# Patient Record
Sex: Female | Born: 1980 | Race: White | Hispanic: No | Marital: Single | Smoking: Current every day smoker
Health system: Southern US, Community
[De-identification: ages and names within clinical notes are randomized; demographics above are authoritative.]

## PROBLEM LIST (undated history)

## (undated) DIAGNOSIS — S6440XA Injury of digital nerve of unspecified finger, initial encounter: Secondary | ICD-10-CM

## (undated) DIAGNOSIS — F419 Anxiety disorder, unspecified: Secondary | ICD-10-CM

## (undated) DIAGNOSIS — F319 Bipolar disorder, unspecified: Secondary | ICD-10-CM

## (undated) DIAGNOSIS — F1011 Alcohol abuse, in remission: Secondary | ICD-10-CM

## (undated) HISTORY — PX: OTHER SURGICAL HISTORY: SHX169

---
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
## 2002-09-18 ENCOUNTER — Emergency Department (HOSPITAL_COMMUNITY): Admission: EM | Admit: 2002-09-18 | Discharge: 2002-09-18 | Payer: Self-pay | Admitting: Emergency Medicine

## 2002-12-18 ENCOUNTER — Emergency Department (HOSPITAL_COMMUNITY): Admission: EM | Admit: 2002-12-18 | Discharge: 2002-12-18 | Payer: Self-pay | Admitting: Emergency Medicine

## 2004-02-25 ENCOUNTER — Emergency Department (HOSPITAL_COMMUNITY): Admission: EM | Admit: 2004-02-25 | Discharge: 2004-02-25 | Payer: Self-pay | Admitting: *Deleted

## 2004-03-03 ENCOUNTER — Emergency Department (HOSPITAL_COMMUNITY): Admission: EM | Admit: 2004-03-03 | Discharge: 2004-03-03 | Payer: Self-pay

## 2005-04-01 ENCOUNTER — Emergency Department (HOSPITAL_COMMUNITY): Admission: EM | Admit: 2005-04-01 | Discharge: 2005-04-01 | Payer: Self-pay | Admitting: Emergency Medicine

## 2006-02-07 ENCOUNTER — Ambulatory Visit (HOSPITAL_COMMUNITY): Admission: EM | Admit: 2006-02-07 | Discharge: 2006-02-08 | Payer: Self-pay | Admitting: Emergency Medicine

## 2008-02-12 ENCOUNTER — Emergency Department (HOSPITAL_COMMUNITY): Admission: EM | Admit: 2008-02-12 | Discharge: 2008-02-12 | Payer: Self-pay | Admitting: Emergency Medicine

## 2010-11-20 NOTE — Op Note (Signed)
Shelly Tucker, Shelly Tucker                ACCOUNT NO.:  [id number]   MEDICAL RECORD NO.:  [id number]          PATIENT TYPE:  OIB   LOCATION:  0098                         FACILITY:  [facility]   PHYSICIAN:  Mark C. Ophelia Charter, M.D.    DATE OF BIRTH:  December 29, 1980   DATE OF PROCEDURE:  02/07/2006  DATE OF DISCHARGE:  02/08/2006                                 OPERATIVE REPORT   PREOPERATIVE DIAGNOSIS:  Multiple right forearm glass lacerations.   PROCEDURE:  1.  Exploration and repair of radial artery and brachial radialis tendon.  2.  Exploration and removal of large glass fragment proximal forearm.  3.  Closure of multiple lacerations.  Total of 10.5 cm.   SURGEON:  Mark C. Ophelia Charter, M.D.   ANESTHESIA:  General endotracheal.   PROCEDURE:  After induction of general anesthesia preoperative Ancef was  given.  The arm was scrubbed with Betadine scrub.  The patient had a large  glass fragment by x-ray and this was palpated with the fingertip, grasped  with the hemostat and removed from the operative field at the beginning of  the procedure prior to scrubbing and prepping.  Tourniquet was then inflated  during the prepping and draping.  Usual extremity sheets and drapes were  applied.  The tourniquet was dropped and a Doppler was used to check.  There  was no pulsatile bleeding noted. There was transverse laceration three  fingerbreadths above the distal wrist crease.  Checked there was a radial  artery and brachial radialis.  This was opened after exploration revealed  normal position of the radial artery and that the artery was lacerated.  It  was opened with the proximal and distal extension making a C-shaped incision  with 4-0 Nylon placed in the skin flaps. Arteries were cleaned off on both  ends.  The loop magnification was used initially and then an operative  microscope was draped and brought in.  The soft tissue was cleaned back and  using 8-0 Nylon with simple interrupted sutures back wall first  and then  multiple simple sutures with the double vessel clamp applied to help hold  the artery.  And actually three sutures were used spaced out and then  individual sutures in between flipping back and forth with the vessel clamp  over.  Saline solution was used and the pick ups were used as well as the  dilators.  There was back flow bleeding as well as antegrade bleeding.  Intermittently, after one or two sutures are placed, clamps are released for  antegrade and retrograde bleeding.  Once final sutures were placed for a  total of nine or 10 sutures, one remaining suture is placed with a small  amount of leakage and then the vessel had excellent flow.  There was  excellent flow, retrograde flow and good Doppler flow with the sterile  Doppler. All the arteries showed less flow distally.  Another laceration  slightly more toward the midline from the initial one with the radial artery  lacerated, showed the laceration of the palmaris longus but the median nerve  was not damaged.  The patient had intact median nerve sensation  preoperatively and this looked like he fitted just through the fascia but  not into the muscle.  The profundus and sub-oblique tendons were intact.  The largest laceration with the largest glass fragment was placed within the  proximal fourth of the forearm slightly more ulnar than in the radial and  the fragment extended down between two bones, which was the glass fragment  that was removed at the beginning.  Ulnar artery was palpated.  During the  case the tourniquet was initially put up for prepping and draping for a  period of 12 minutes and then deflated for identification of the radial  artery with good flow.  The clamp was applied and it was put up after the  radial artery was repaired for 2-0 Ethibond suture of the brachial radialis  tendon.  Flexor carpi radialis also had a partial laceration to it and it  was repaired with 3-0 Ethibond suture.  Next the  proximal incision was  inspected and lacerations deep into the flexor muscles.  There was teeny  tendon remnants and with motion of the fingers it appeared that this is in  the superficialis tendons.  Ulnar pulse was palpated with the finger tip as  well as the Doppler just lateral to the laceration, confirmed with Doppler.  After copious irrigation of this wound, the skin is closed with 4-0 Nylon  closing all wounds and confirming that the radial artery was repaired.  There was normal Allen's test at the wrist.  Excellent capillary refill and  normal Doppler's of both radial and ulnar arteries by this time with good  ulnar flow at the wrist as well.  It appeared that the ulnar artery had had  some compression from the hematoma from the laceration when the glass  fragment was present and this was washed out and evacuated.  The spasm of  the vessel subsided with good flow.  Xeroform 4 x 4s, web roll and dorsal  wrist splint was applied with the wrist in flexion.  Instrument count and  needle count was correct including the 8-0 needles.  Dressings were placed  and the patient was transferred to the recovery room in stable condition.      Mark C. Ophelia Charter, M.D.  Electronically Signed     MCY/MEDQ  D:  02/07/2006  T:  02/08/2006  Job:  [id number]

## 2010-11-20 NOTE — Discharge Summary (Signed)
NAMEAERON, LHEUREUX                ACCOUNT NO.:  [id number]   MEDICAL RECORD NO.:  [id number]          PATIENT TYPE:  OIB   LOCATION:  0098                         FACILITY:  [facility]   PHYSICIAN:  Mark C. Ophelia Charter, M.D.    DATE OF BIRTH:  April 15, 1981   DATE OF ADMISSION:  02/07/2006  DATE OF DISCHARGE:  02/08/2006                               DISCHARGE SUMMARY   FINAL DIAGNOSES:  1. Right forearm laceration with pulsatile uncontrolled bleeding.  2. Alcohol intoxication.   A 30 year old female presented with a forearm laceration, pulsatile  bleeding uncontrolled, and slurred speech.  The patient was  uncooperative with examination and had a pressure dressing on.  No  distention of the fingers secondary to pressure dressing.  X-ray showed  laceration of the forearm.  She was taken to the operating room on an  emergent basis where she underwent repair of the radial artery using the  microscope, repair of the brachial radialis tendon, flexor carpi  radialis tendon, and removal of glass fragment.  Tourniquet time was 25  minutes.  Postoperatively, the patient complained of arm pain.  Her  alcohol level was 313 on admission.  She complained of some decreased  sensation in the ulnar nerve distribution.  Profundus and ring finger  flexors were working, first dorsal interosseous was working.  She was  taking Tylox for pain and was stable.  She was discharged with office  follow-up in 1 week.      Mark C. Ophelia Charter, M.D.  Electronically Signed     MCY/MEDQ  D:  05/25/2006  T:  05/25/2006  Job:  [id number]

## 2012-02-01 ENCOUNTER — Emergency Department (HOSPITAL_COMMUNITY)
Admission: EM | Admit: 2012-02-01 | Discharge: 2012-02-01 | Discharge status: Against Medical Advice | Payer: Self-pay | Attending: Emergency Medicine | Admitting: Emergency Medicine

## 2012-02-01 ENCOUNTER — Emergency Department (HOSPITAL_COMMUNITY): Payer: Self-pay

## 2012-02-01 DIAGNOSIS — M79609 Pain in unspecified limb: Secondary | ICD-10-CM | POA: Insufficient documentation

## 2012-02-01 DIAGNOSIS — M7989 Other specified soft tissue disorders: Secondary | ICD-10-CM | POA: Insufficient documentation

## 2012-02-01 NOTE — ED Notes (Signed)
Patient not in waiting area.

## 2012-02-01 NOTE — ED Notes (Signed)
PT reports walking in yard and turned ankle . Pt now has pain and swelling. Pt reports having multiple beers starting this afternoon.

## 2012-02-02 ENCOUNTER — Encounter (HOSPITAL_COMMUNITY): Payer: Self-pay | Admitting: *Deleted

## 2012-02-02 ENCOUNTER — Emergency Department (HOSPITAL_COMMUNITY)
Admission: EM | Admit: 2012-02-02 | Discharge: 2012-02-02 | Disposition: A | Discharge status: Home / Self Care | Payer: Self-pay | Attending: Emergency Medicine | Admitting: Emergency Medicine

## 2012-02-02 ENCOUNTER — Emergency Department (HOSPITAL_COMMUNITY): Payer: Self-pay

## 2012-02-02 DIAGNOSIS — E119 Type 2 diabetes mellitus without complications: Secondary | ICD-10-CM | POA: Insufficient documentation

## 2012-02-02 DIAGNOSIS — W1789XA Other fall from one level to another, initial encounter: Secondary | ICD-10-CM | POA: Insufficient documentation

## 2012-02-02 DIAGNOSIS — F1010 Alcohol abuse, uncomplicated: Secondary | ICD-10-CM | POA: Insufficient documentation

## 2012-02-02 DIAGNOSIS — S82899A Other fracture of unspecified lower leg, initial encounter for closed fracture: Secondary | ICD-10-CM | POA: Insufficient documentation

## 2012-02-02 MED ORDER — LORAZEPAM 1 MG PO TABS: 0.0000 mg | ORAL_TABLET | Freq: Four times a day (QID) | ORAL | Status: DC

## 2012-02-02 MED ORDER — ZOLPIDEM TARTRATE 5 MG PO TABS: 5.0000 mg | ORAL_TABLET | Freq: Every evening | ORAL | Status: DC | PRN

## 2012-02-02 MED ORDER — VITAMIN B-1 100 MG PO TABS: 100.0000 mg | ORAL_TABLET | Freq: Every day | ORAL | Status: DC

## 2012-02-02 MED ORDER — ADULT MULTIVITAMIN W/MINERALS CH: 1.0000 | ORAL_TABLET | Freq: Every day | ORAL | Status: DC

## 2012-02-02 MED ORDER — LORAZEPAM 2 MG/ML IJ SOLN: 1.0000 mg | Freq: Four times a day (QID) | INTRAMUSCULAR | Status: DC | PRN

## 2012-02-02 MED ORDER — LORAZEPAM 1 MG PO TABS: 0.0000 mg | ORAL_TABLET | Freq: Two times a day (BID) | ORAL | Status: DC

## 2012-02-02 MED ORDER — NICOTINE 21 MG/24HR TD PT24: 21.0000 mg | MEDICATED_PATCH | Freq: Every day | TRANSDERMAL | Status: DC

## 2012-02-02 MED ORDER — IBUPROFEN 400 MG PO TABS: 600.0000 mg | ORAL_TABLET | Freq: Three times a day (TID) | ORAL | Status: DC | PRN

## 2012-02-02 MED ORDER — FOLIC ACID 1 MG PO TABS: 1.0000 mg | ORAL_TABLET | Freq: Every day | ORAL | Status: DC

## 2012-02-02 MED ORDER — LORAZEPAM 1 MG PO TABS: 1.0000 mg | ORAL_TABLET | Freq: Four times a day (QID) | ORAL | Status: DC | PRN

## 2012-02-02 MED ORDER — THIAMINE HCL 100 MG/ML IJ SOLN: 100.0000 mg | Freq: Every day | INTRAMUSCULAR | Status: DC

## 2012-02-02 NOTE — ED Provider Notes (Addendum)
History    This chart was scribed for Toy Baker, MD, MD by Smitty Pluck. The patient was seen in room TR05C and the patient's care was started at 1:50PM.   CSN: [id number]  Arrival date & time 02/02/12  1243   None     No chief complaint on file.   (Consider location/radiation/quality/duration/timing/severity/associated sxs/prior treatment) The history is provided by the patient.   Shelly Tucker is a 31 y.o. female who presents to the Emergency Department complaining of moderate left ankle pain onset 1 day ago and wanting help with alcohol addiction. Pt reports that she was intoxicated and fell in hole last night. She felt a snap in her left ankle during fall. She reports that she can not bear weight on left ankle. She has injured right ankle in past. Pt denies SI and HI. Pt drinks beer, wine and liquor. Pt reports using marijuana. Pt denies being in rehab before. Pt called [facility] for assistance.   Past Medical History  Diagnosis Date  . Diabetes mellitus     History reviewed. No pertinent past surgical history.  No family history on file.  History  Substance Use Topics  . Smoking status: Not on file  . Smokeless tobacco: Not on file  . Alcohol Use: Yes    OB History    Grav Para Term Preterm Abortions TAB SAB Ect Mult Living                  Review of Systems  Constitutional: Negative for fever and chills.  Respiratory: Negative for shortness of breath.   Gastrointestinal: Negative for nausea and vomiting.  Neurological: Negative for weakness.  All other systems reviewed and are negative.    Allergies  Review of patient's allergies indicates no known allergies.  Home Medications  No current outpatient prescriptions on file.  BP 133/81  Pulse 116  Temp 98.3 F (36.8 C) (Oral)  Resp 16  SpO2 96%  LMP 11/02/2011  Physical Exam  Nursing note and vitals reviewed. Constitutional: She is oriented to person, place, and time. She appears  well-developed and well-nourished. No distress.  HENT:  Head: Normocephalic and atraumatic.  Eyes: Conjunctivae are normal.  Neck: Normal range of motion. Neck supple.  Pulmonary/Chest: Effort normal. No respiratory distress.  Musculoskeletal:       Left ankle swelling and ecchymosis with tenderness along lateral and medial malleolus.  Neurovascular intact  Neurological: She is alert and oriented to person, place, and time.  Skin: Skin is warm and dry.  Psychiatric: She has a normal mood and affect. Her behavior is normal.    ED Course  Procedures (including critical care time) DIAGNOSTIC STUDIES: Oxygen Saturation is 96% on room air, normal by my interpretation.    COORDINATION OF CARE: 1:56PM EDP discusses pt ED treatment with pt     Labs Reviewed - No data to display Dg Ankle Complete Left  02/02/2012  *RADIOLOGY REPORT*  Clinical Data: Stepped in a hole last night, now with ankle pain, swelling and bruising  LEFT ANKLE COMPLETE - 3+ VIEW  Comparison: None.  Findings:  There is an oblique, minimally displaced fracture of the distal fibula which extends to the distal tib-fib joint.  This finding is without definite displacement of the ankle mortise, though the mortise radiograph is slightly degraded secondary to obliquity.  No definite ankle joint effusion.   Incidental note is made of a small bone island within the distal tibia as well as a prominent os  trigonum.  There is expected adjacent soft tissue swelling.  No radiopaque foreign body.  IMPRESSION: Oblique, minimally displaced fracture the distal tibia with extension to the distal tib-fib joint.  Original Report Authenticated By: Waynard Reeds, M.D.     No diagnosis found.    MDM  Pt to have splint applied to left ankle, she has mobile crisis team member here who will work on placement for her etoh abuse   I personally performed the services described in this documentation, which was scribed in my presence. The  recorded information has been reviewed and considered.  3:27 PM Pt has changed her mind and now doesn't want detox--ankle splinted by tech and pt given crutches and ortho referral      Toy Baker, MD 02/02/12 1405  Toy Baker, MD 02/02/12 [phone]

## 2012-02-02 NOTE — ED Notes (Signed)
Larey Seat in a hole last night, and hurting lt. Ankle. Deformity. Swelling, cms intact. Here last night but left ama.

## 2012-02-02 NOTE — ED Notes (Signed)
Tried to call patient from contact information no answer.

## 2012-02-02 NOTE — ED Notes (Signed)
Continue from secondary Able to move all toes equal.  Here with detox representative who patient called today.  States have been drinking for years and one day ago drank beer and tequila mix unknown amount.  Calm cooperative states did not drink today.  Denies HI or SI.

## 2012-02-02 NOTE — Progress Notes (Signed)
Orthopedic Tech Progress Note Patient Details:  Shelly Tucker 1980-11-24 [id number]  Ortho Devices Type of Ortho Device: Ace wrap;Post (short) splint Splint Material: Fiberglass Ortho Device/Splint Location: (L) LE Ortho Device/Splint Interventions: Application   Jennye Moccasin 02/02/2012, 3:21 PM

## 2012-02-02 NOTE — ED Notes (Signed)
repaged ortho to apply splint.

## 2012-02-02 NOTE — ED Notes (Signed)
Patient back in room. Stated upset because boyfriend was in [facility]

## 2012-02-02 NOTE — ED Notes (Signed)
Looking for patient in ED, ED waiting room, and outside. Unable to find patient at this time.

## 2012-02-02 NOTE — ED Notes (Signed)
EDP Speaking with patient who is requesting not to stay for detox.

## 2012-02-02 NOTE — ED Notes (Signed)
Patient will be discharged and will follow up with out patient detox referral or come back to ED.

## 2012-02-02 NOTE — ED Notes (Signed)
Spoke with Ortho will apply splint

## 2012-02-08 ENCOUNTER — Emergency Department (HOSPITAL_COMMUNITY): Payer: Self-pay

## 2012-02-08 ENCOUNTER — Emergency Department (HOSPITAL_COMMUNITY)
Admission: EM | Admit: 2012-02-08 | Discharge: 2012-02-08 | Disposition: A | Discharge status: Home Health | Payer: Self-pay | Attending: Emergency Medicine | Admitting: Emergency Medicine

## 2012-02-08 ENCOUNTER — Encounter (HOSPITAL_COMMUNITY): Payer: Self-pay

## 2012-02-08 DIAGNOSIS — S82899A Other fracture of unspecified lower leg, initial encounter for closed fracture: Secondary | ICD-10-CM | POA: Insufficient documentation

## 2012-02-08 DIAGNOSIS — Y998 Other external cause status: Secondary | ICD-10-CM | POA: Insufficient documentation

## 2012-02-08 DIAGNOSIS — Y9301 Activity, walking, marching and hiking: Secondary | ICD-10-CM | POA: Insufficient documentation

## 2012-02-08 DIAGNOSIS — Y92410 Unspecified street and highway as the place of occurrence of the external cause: Secondary | ICD-10-CM | POA: Insufficient documentation

## 2012-02-08 DIAGNOSIS — E119 Type 2 diabetes mellitus without complications: Secondary | ICD-10-CM | POA: Insufficient documentation

## 2012-02-08 DIAGNOSIS — S82892A Other fracture of left lower leg, initial encounter for closed fracture: Secondary | ICD-10-CM

## 2012-02-08 DIAGNOSIS — X58XXXA Exposure to other specified factors, initial encounter: Secondary | ICD-10-CM | POA: Insufficient documentation

## 2012-02-08 MED ORDER — NAPROXEN 500 MG PO TABS: 500.0000 mg | ORAL_TABLET | Freq: Two times a day (BID) | ORAL | Status: AC

## 2012-02-08 NOTE — Progress Notes (Signed)
Orthopedic Tech Progress Note Patient Details:  Shelly Tucker [birth year] [id number]  Ortho Devices Type of Ortho Device: Short leg splint;Stirrup splint Splint Material: Fiberglass Ortho Device/Splint Interventions: Application   Cammer, Mickie Bail 02/08/2012, 1:20 PM

## 2012-02-08 NOTE — ED Provider Notes (Signed)
History   This chart was scribed for Shelly Jakes, MD by Shelly Tucker. The patient was seen in room TR06C/TR06C. Patient's care was started at 1013.     CSN: [id number]  Arrival date & time 02/08/12  1013   First MD Initiated Contact with Patient 02/08/12 1111      Chief Complaint  Patient presents with  . Ankle Pain  . Follow-up    (Consider location/radiation/quality/duration/timing/severity/associated sxs/prior treatment) Patient is a 31 y.o. female presenting with ankle pain. The history is provided by the patient. No language interpreter was used.  Ankle Pain  The incident occurred in the street. The injury mechanism was torsion. The pain is present in the left ankle. The pain is moderate. The pain has been constant since onset. Associated symptoms include inability to bear weight. She reports no foreign bodies present. The symptoms are aggravated by activity and bearing weight.    Shelly Tucker is a 31 y.o. female who presents to the Emergency Department complaining of constant, moderate left ankle pain after re-injuring her left ankle. Patient states that she tripped while stepping up onto the sidewalk. Patient states that she hasn't been able to get her hard cast placed at the orthopedist's office because she doesn't have insurance. Patient denies any other symptoms.  Previous Chart Review: Patient was seen on 02/02/12 by Dr. Lorre Tucker complaining of left ankle pain after twisting her ankle on 02/01/12. The X-ray taken at that time showed a mininal displaced fracture of the distal tibia. Patient was fitted with a splint to the left ankle and referred to an orthopedist.    Past Medical History  Diagnosis Date  . Diabetes mellitus     No past surgical history on file.  No family history on file.  History  Substance Use Topics  . Smoking status: Not on file  . Smokeless tobacco: Not on file  . Alcohol Use: Yes    OB History    Grav Para Term Preterm  Abortions TAB SAB Ect Mult Living                  Review of Systems  Constitutional: Negative for fever.  Respiratory: Negative for shortness of breath.   Cardiovascular: Negative for chest pain.  Gastrointestinal: Negative for abdominal pain.  Neurological: Negative for headaches.    Allergies  Review of patient's allergies indicates no known allergies.  Home Medications   Current Outpatient Rx  Name Route Sig Dispense Refill  . NAPROXEN 500 MG PO TABS Oral Take 1 tablet (500 mg total) by mouth 2 (two) times daily. 14 tablet 0    BP 140/82  Pulse 104  Temp 98.6 F (37 C) (Oral)  Resp 18  SpO2 99%  LMP 11/02/2011  Physical Exam  Cardiovascular: Normal rate and regular rhythm.   No murmur heard. Pulmonary/Chest: Effort normal and breath sounds normal. No respiratory distress. She has no wheezes.  Abdominal: Bowel sounds are normal. There is no tenderness.  Musculoskeletal:       Left ankle: tenderness.       Bruising on the lateral part of the left ankle more than medial. There is bruising that has accumulated on the left forefoot and the toes.  Skin:       Capillary refills less than 1 second.    ED Course  Procedures (including critical care time) DIAGNOSTIC STUDIES: Oxygen Saturation is 99% on room air, normal  by my interpretation.    COORDINATION OF CARE: 11:20am-  Patient informed of current plan for treatment and evaluation and agrees with plan at this time. Will do another X-ray of left ankle.  Dg Ankle Complete Left  02/08/2012  *RADIOLOGY REPORT*  Clinical Data: Fall, ankle pain.  LEFT ANKLE COMPLETE - 3+ VIEW  Comparison: 02/02/2012  Findings: The oblique distal fibular fracture is again noted. Increasing displacement since prior study.  Overlying soft tissue swelling.  No acute tibial abnormality.  The sclerotic focus in the medial malleolus likely bone island.  IMPRESSION: Slight increased displacement of the oblique distal fibular fracture.  Original  Report Authenticated By: Cyndie Chime, M.D.     1. Ankle fracture, left       MDM  Patient status post ankle fracture at the end of July has not follow up with orthopedic shaft today splint was loose and also soft and not really providing adequate support x-ray repeated which shows column signs of worsening displacement compared to the original fracture patient did note that she had reinjured the ankle. Free cast with a stirrup type splint and give referral to orthopedics.     I personally performed the services described in this documentation, which was scribed in my presence. The recorded information has been reviewed and considered.    Shelly Jakes, MD 02/08/12 [phone]

## 2012-02-08 NOTE — ED Notes (Signed)
Pt with broken ankle, sts unable to follow up with orthopeadic due to insurance so here today for hard cast and reevaluation.

## 2013-07-29 ENCOUNTER — Emergency Department (HOSPITAL_COMMUNITY): Payer: Self-pay

## 2013-07-29 ENCOUNTER — Encounter (HOSPITAL_COMMUNITY): Payer: Self-pay | Admitting: Emergency Medicine

## 2013-07-29 ENCOUNTER — Emergency Department (HOSPITAL_COMMUNITY)
Admission: EM | Admit: 2013-07-29 | Discharge: 2013-07-29 | Disposition: A | Discharge status: Home / Self Care | Payer: Self-pay | Attending: Emergency Medicine | Admitting: Emergency Medicine

## 2013-07-29 DIAGNOSIS — S61209A Unspecified open wound of unspecified finger without damage to nail, initial encounter: Secondary | ICD-10-CM | POA: Insufficient documentation

## 2013-07-29 DIAGNOSIS — Y93G1 Activity, food preparation and clean up: Secondary | ICD-10-CM | POA: Insufficient documentation

## 2013-07-29 DIAGNOSIS — S61216A Laceration without foreign body of right little finger without damage to nail, initial encounter: Secondary | ICD-10-CM

## 2013-07-29 DIAGNOSIS — W268XXA Contact with other sharp object(s), not elsewhere classified, initial encounter: Secondary | ICD-10-CM | POA: Insufficient documentation

## 2013-07-29 DIAGNOSIS — IMO0002 Reserved for concepts with insufficient information to code with codable children: Secondary | ICD-10-CM | POA: Insufficient documentation

## 2013-07-29 DIAGNOSIS — Y929 Unspecified place or not applicable: Secondary | ICD-10-CM | POA: Insufficient documentation

## 2013-07-29 DIAGNOSIS — Z23 Encounter for immunization: Secondary | ICD-10-CM | POA: Insufficient documentation

## 2013-07-29 DIAGNOSIS — F1011 Alcohol abuse, in remission: Secondary | ICD-10-CM | POA: Insufficient documentation

## 2013-07-29 DIAGNOSIS — F17200 Nicotine dependence, unspecified, uncomplicated: Secondary | ICD-10-CM | POA: Insufficient documentation

## 2013-07-29 MED ORDER — CEPHALEXIN 500 MG PO CAPS: 500.0000 mg | ORAL_CAPSULE | Freq: Four times a day (QID) | ORAL | Status: DC

## 2013-07-29 MED ORDER — ACETAMINOPHEN-CODEINE #3 300-30 MG PO TABS
1.0000 | ORAL_TABLET | Freq: Four times a day (QID) | ORAL | Status: DC | PRN
Start: 2013-07-29 — End: 2013-10-23

## 2013-07-29 MED ORDER — HYDROCODONE-ACETAMINOPHEN 5-325 MG PO TABS
2.0000 | ORAL_TABLET | Freq: Once | ORAL | Status: AC
  Administered 2013-07-29: 2 via ORAL
  Filled 2013-07-29: qty 2

## 2013-07-29 MED ORDER — TETANUS-DIPHTH-ACELL PERTUSSIS 5-2.5-18.5 LF-MCG/0.5 IM SUSP
0.5000 mL | Freq: Once | INTRAMUSCULAR | Status: AC
  Administered 2013-07-29: 0.5 mL via INTRAMUSCULAR
  Filled 2013-07-29: qty 0.5

## 2013-07-29 NOTE — ED Notes (Signed)
Pt cut her hand on a glass while washing a glass, pt has a deep lac between the ring and pinky finger on the right hand , bleeding is controlled

## 2013-07-29 NOTE — Discharge Instructions (Signed)
Finger Avulsion  °When the tip of the finger is lost, a new nail may grow back if part of the fingernail is left. The new nail may be deformed. If just the tip of the finger is lost, no repair may be needed unless there is bone showing. If bone is showing, your caregiver may need to remove the protruding bone and put on a bandage. Your caregiver will do what is best for you. Most of the time when a fingertip is lost, the end will gradually grow back on and look fairly normal, but it may remain sensitive to pressure and temperature extremes for a long time. °HOME CARE INSTRUCTIONS  °· Keep your hand elevated above your heart to relieve pain and swelling. °· Keep your dressing dry and clean. °· Change your bandage in 24 hours or as directed. °· Only take over-the-counter or prescription medicines for pain, discomfort, or fever as directed by your caregiver. °· See your caregiver as needed for problems. °SEEK MEDICAL CARE IF:  °· You have increased pain, swelling, drainage, or bleeding. °· You have a fever. °· You have swelling that spreads from your finger and into your hand. °Make sure to check to see if you need a tetanus booster. °Document Released: 08/30/2001 Document Revised: 12/21/2011 Document Reviewed: 07/25/2008 °ExitCare® Patient Information ©2014 [employer]. ° °Laceration Care, Adult °A laceration is a cut or lesion that goes through all layers of the skin and into the tissue just beneath the skin. °TREATMENT  °Some lacerations may not require closure. Some lacerations may not be able to be closed due to an increased risk of infection. It is important to see your caregiver as soon as possible after an injury to minimize the risk of infection and maximize the opportunity for successful closure. °If closure is appropriate, pain medicines may be given, if needed. The wound will be cleaned to help prevent infection. Your caregiver will use stitches (sutures), staples, wound glue (adhesive), or skin adhesive  strips to repair the laceration. These tools bring the skin edges together to allow for faster healing and a better cosmetic outcome. However, all wounds will heal with a scar. Once the wound has healed, scarring can be minimized by covering the wound with sunscreen during the day for 1 full year. °HOME CARE INSTRUCTIONS  °For sutures or staples: °· Keep the wound clean and dry. °· If you were given a bandage (dressing), you should change it at least once a day. Also, change the dressing if it becomes wet or dirty, or as directed by your caregiver. °· Wash the wound with soap and water 2 times a day. Rinse the wound off with water to remove all soap. Pat the wound dry with a clean towel. °· After cleaning, apply a thin layer of the antibiotic ointment as recommended by your caregiver. This will help prevent infection and keep the dressing from sticking. °· You may shower as usual after the first 24 hours. Do not soak the wound in water until the sutures are removed. °· Only take over-the-counter or prescription medicines for pain, discomfort, or fever as directed by your caregiver. °· Get your sutures or staples removed as directed by your caregiver. °For skin adhesive strips: °· Keep the wound clean and dry. °· Do not get the skin adhesive strips wet. You may bathe carefully, using caution to keep the wound dry. °· If the wound gets wet, pat it dry with a clean towel. °· Skin adhesive strips will fall off   on their own. You may trim the strips as the wound heals. Do not remove skin adhesive strips that are still stuck to the wound. They will fall off in time. °For wound adhesive: °· You may briefly wet your wound in the shower or bath. Do not soak or scrub the wound. Do not swim. Avoid periods of heavy perspiration until the skin adhesive has fallen off on its own. After showering or bathing, gently pat the wound dry with a clean towel. °· Do not apply liquid medicine, cream medicine, or ointment medicine to your  wound while the skin adhesive is in place. This may loosen the film before your wound is healed. °· If a dressing is placed over the wound, be careful not to apply tape directly over the skin adhesive. This may cause the adhesive to be pulled off before the wound is healed. °· Avoid prolonged exposure to sunlight or tanning lamps while the skin adhesive is in place. Exposure to ultraviolet light in the first year will darken the scar. °· The skin adhesive will usually remain in place for 5 to 10 days, then naturally fall off the skin. Do not pick at the adhesive film. °You may need a tetanus shot if: °· You cannot remember when you had your last tetanus shot. °· You have never had a tetanus shot. °If you get a tetanus shot, your arm may swell, get red, and feel warm to the touch. This is common and not a problem. If you need a tetanus shot and you choose not to have one, there is a rare chance of getting tetanus. Sickness from tetanus can be serious. °SEEK MEDICAL CARE IF:  °· You have redness, swelling, or increasing pain in the wound. °· You see a red line that goes away from the wound. °· You have yellowish-white fluid (pus) coming from the wound. °· You have a fever. °· You notice a bad smell coming from the wound or dressing. °· Your wound breaks open before or after sutures have been removed. °· You notice something coming out of the wound such as wood or glass. °· Your wound is on your hand or foot and you cannot move a finger or toe. °SEEK IMMEDIATE MEDICAL CARE IF:  °· Your pain is not controlled with prescribed medicine. °· You have severe swelling around the wound causing pain and numbness or a change in color in your arm, hand, leg, or foot. °· Your wound splits open and starts bleeding. °· You have worsening numbness, weakness, or loss of function of any joint around or beyond the wound. °· You develop painful lumps near the wound or on the skin anywhere on your body. °MAKE SURE YOU:  °· Understand  these instructions. °· Will watch your condition. °· Will get help right away if you are not doing well or get worse. °Document Released: 06/21/2005 Document Revised: 09/13/2011 Document Reviewed: 12/15/2010 °ExitCare® Patient Information ©2014 [employer]. ° °

## 2013-07-29 NOTE — ED Provider Notes (Signed)
CSN: [id number]     Arrival date & time 07/29/13  1810 History  This chart was scribed for non-physician practitioner, Cherrie DistanceFrances Natoshia Souter, PA-C,working with Ethelda ChickMartha K Linker, MD, by Karle PlumberJennifer Tensley, ED Scribe.  This patient was seen in room TR10C/TR10C and the patient's care was started at 6:35 PM.  Chief Complaint  Patient presents with  . Finger Injury   The history is provided by the patient. No language interpreter was used.   HPI Comments:  Shelly Tucker is a [age] y.o. female who presents to the Emergency Department complaining of a severe laceration to her right 5th metacarpal. Pt states she was washing a drinking glass when it broke and cut her at the base of the finger. She reports associated pain and bleeding that is now controlled. Pt denies numbness of the finger. Pt states her last tetanus vaccination has "been a while". She is able to move the finger. Pt states she is right hand dominant.   History reviewed. No pertinent past medical history. History reviewed. No pertinent past surgical history. History reviewed. No pertinent family history. History  Substance Use Topics  . Smoking status: Current Every Day Smoker  . Smokeless tobacco: Not on file  . Alcohol Use: Yes   OB History   Grav Para Term Preterm Abortions TAB SAB Ect Mult Living                 Review of Systems  Skin: Positive for wound (right 5th metacarpal laceration).  Neurological: Negative for numbness.  All other systems reviewed and are negative.    Allergies  Review of patient's allergies indicates no known allergies.  Home Medications  No current outpatient prescriptions on file. Triage Vitals: BP 144/91  Temp(Src) 98 F (36.7 C) (Oral)  Resp 18  Ht 5\' 5"  (1.651 m)  Wt 145 lb (65.772 kg)  BMI 24.13 kg/m2  SpO2 96% Physical Exam  Nursing note and vitals reviewed. Constitutional: She is oriented to person, place, and time. She appears well-developed and well-nourished.  HENT:  Head:  Normocephalic and atraumatic.  Mouth/Throat: Oropharynx is clear and moist. No oropharyngeal exudate.  Eyes: Conjunctivae are normal. No scleral icterus.  Neck: Normal range of motion.  Pulmonary/Chest: Effort normal.  Musculoskeletal:       Right hand: She exhibits tenderness, disruption of two-point discrimination and laceration. She exhibits normal range of motion and normal capillary refill. Decreased sensation noted. Decreased sensation is present in the ulnar distribution. Normal strength noted. She exhibits no finger abduction, no thumb/finger opposition and no wrist extension trouble.       Hands: Neurological: She is alert and oriented to person, place, and time. She exhibits normal muscle tone. Coordination normal.  Skin: Skin is warm and dry. No rash noted. No erythema. No pallor.  Psychiatric: She has a normal mood and affect. Her behavior is normal. Judgment and thought content normal.    ED Course  Procedures (including critical care time) DIAGNOSTIC STUDIES: Oxygen Saturation is 96% on RA, adequate by my interpretation.   COORDINATION OF CARE: 6:40 PM- Will update tetanus and X-Ray finger. Will have hand surgeon on call evaluate finger. Pt verbalizes understanding and agrees to plan.  LACERATION REPAIR PROCEDURE NOTE The patient's identification was confirmed and consent was obtained. This procedure was performed by Cherrie DistanceFrances Aiyden Lauderback, PA-C at 7:27 PM. Site: base of right 5 th metacarpal Sterile procedures observed Anesthetic used (type and amt): Lidocaine 2% without Epinephrine Suture type/size:4-0 Vicryl Plus Length: 1.5 cm #  of Sutures: 4 Technique: simple, interrupted  Complexity: simple Antibx ointment applied Tetanus ordered Site anesthetized, irrigated with NS, explored without evidence of foreign body, wound well approximated, site covered with dry, sterile dressing.  Patient tolerated procedure well without complications. Instructions for care discussed  verbally and patient provided with additional written instructions for homecare and f/u.   Medications  Tdap (BOOSTRIX) injection 0.5 mL (0.5 mLs Intramuscular Given 07/29/13 1901)  HYDROcodone-acetaminophen (NORCO/VICODIN) 5-325 MG per tablet 2 tablet (2 tablets Oral Given 07/29/13 1902)    Labs Review Labs Reviewed - No data to display Imaging Review Dg Hand Complete Right  07/29/2013   CLINICAL DATA:  Right finger laceration, glass injury  EXAM: RIGHT HAND - COMPLETE 3+ VIEW  COMPARISON:  None.  FINDINGS: Soft tissue laceration noted of the right fifth digit proximally. Normal alignment. No fracture. No radiopaque foreign body. Preserved joint spaces. No significant arthropathy.  IMPRESSION: Proximal right fifth digit laceration.   Electronically Signed   By: Ruel Favors M.D.   On: 07/29/2013 19:00    EKG Interpretation   None       MDM  5th finger laceration  Patient here with laceration of right 5th finger.  Dr. Janee Morn in to see the patient, did not feel there was tendon disruption, will see the patient in follow up to discuss the likely nerve damage.  Will start on antibiotics and pain control.  I personally performed the services described in this documentation, which was scribed in my presence. The recorded information has been reviewed and is accurate.     Izola Price Marisue Humble, Cordelia Poche 07/29/13 2004

## 2013-07-29 NOTE — ED Provider Notes (Signed)
Medical screening examination/treatment/procedure(s) were performed by non-physician practitioner and as supervising physician I was immediately available for consultation/collaboration.  EKG Interpretation   None        Ethelda ChickMartha K Linker, MD 07/29/13 2006

## 2013-08-01 ENCOUNTER — Encounter (HOSPITAL_BASED_OUTPATIENT_CLINIC_OR_DEPARTMENT_OTHER): Payer: Self-pay | Admitting: *Deleted

## 2013-08-01 ENCOUNTER — Other Ambulatory Visit: Payer: Self-pay | Admitting: Orthopedic Surgery

## 2013-08-02 NOTE — H&P (Signed)
Shelly Tucker is an 33 y.o. female.   CC / Reason for Visit: Right small finger laceration HPI: This patient is a 33 year old female who presents for evaluation of a laceration involving the right small finger.  It is at the base of the digit, in the web space, and was irrigated and closed in the ED.  She has numbness along the radial border of the digit.  She has some preexistent numbness in some of the median distribution from a previous forearm laceration.  Past Medical History  Diagnosis Date  . History of ETOH abuse   . Laceration of digital nerve of finger     right small finger    History reviewed. No pertinent past surgical history.  History reviewed. No pertinent family history. Social History:  reports that she has been smoking Cigarettes.  She has been smoking about 0.50 packs per day. She has never used smokeless tobacco. She reports that she drinks alcohol. She reports that she uses illicit drugs.  Allergies: No Known Allergies  No prescriptions prior to admission    No results found for this or any previous visit (from the past 48 hour(s)). No results found.  Review of Systems  All other systems reviewed and are negative.    Height 5\' 5"  (1.651 m), weight 65.772 kg (145 lb), last menstrual period 07/08/2013. Physical Exam  Constitutional:  WD, WN, NAD HEENT:  NCAT, EOMI Neuro/Psych:  Alert & oriented to person, place, and time; appropriate mood & affect Lymphatic: No generalized UE edema or lymphadenopathy Extremities / MSK:  Both UE are normal with respect to appearance, ranges of motion, joint stability, muscle strength/tone, sensation, & perfusion except as otherwise noted:  Right small finger with laceration at the radial base of the digit, some maceration in the web space.  4 mm 2-point discrimination ulnarly, greater than 15 radially.  Flexor tendons appear to be intact  Labs / Xrays:  No radiographic studies obtained today.  Assessment: Right small  finger laceration with radial digital nerve laceration  Plan:  I discussed these findings with her and recommended nerve repair to avoid complications of neuroma and to restore better sensibility to the tip of the small finger.  [facility]  She has not gotten the antibiotic prescription filled, so I encouraged her to do this and I instructed her in further wound care between now and surgery.  I reviewed the extent of the incisions for surgery which would be larger than just laceration she has now.  The details of the operative procedure were discussed with the patient.  Questions were invited and answered.  In addition to the goal of the procedure, the risks of the procedure to include but not limited to bleeding; infection; damage to the nerves or blood vessels that could result in bleeding, numbness, weakness, chronic pain, and the need for additional procedures; stiffness; the need for revision surgery; and anesthetic risks, the worst of which is death, were reviewed.  No specific outcome was guaranteed or implied.  Informed consent was obtained.    Jaelon Gatley A. 08/02/2013, 3:08 PM

## 2013-08-06 ENCOUNTER — Ambulatory Visit (HOSPITAL_BASED_OUTPATIENT_CLINIC_OR_DEPARTMENT_OTHER): Admission: RE | Admit: 2013-08-06 | Payer: Self-pay | Source: Ambulatory Visit | Admitting: Orthopedic Surgery

## 2013-08-06 ENCOUNTER — Encounter (HOSPITAL_BASED_OUTPATIENT_CLINIC_OR_DEPARTMENT_OTHER): Payer: Self-pay | Admitting: Certified Registered"

## 2013-08-06 HISTORY — DX: Alcohol abuse, in remission: F10.11

## 2013-08-06 HISTORY — DX: Injury of digital nerve of unspecified finger, initial encounter: S64.40XA

## 2013-08-06 SURGERY — REPAIR, NERVE
Anesthesia: General | Site: Finger | Laterality: Right

## 2013-08-06 MED ORDER — MIDAZOLAM HCL 2 MG/2ML IJ SOLN
INTRAMUSCULAR | Status: AC
  Filled 2013-08-06: qty 2

## 2013-08-06 MED ORDER — FENTANYL CITRATE 0.05 MG/ML IJ SOLN
INTRAMUSCULAR | Status: AC
  Filled 2013-08-06: qty 6

## 2013-10-23 ENCOUNTER — Encounter (HOSPITAL_COMMUNITY): Payer: Self-pay | Admitting: Emergency Medicine

## 2013-10-23 ENCOUNTER — Emergency Department (HOSPITAL_COMMUNITY)
Admission: EM | Admit: 2013-10-23 | Discharge: 2013-10-24 | Disposition: A | Discharge status: Home / Self Care | Payer: Self-pay | Attending: Emergency Medicine | Admitting: Emergency Medicine

## 2013-10-23 DIAGNOSIS — F411 Generalized anxiety disorder: Secondary | ICD-10-CM | POA: Insufficient documentation

## 2013-10-23 DIAGNOSIS — F1210 Cannabis abuse, uncomplicated: Secondary | ICD-10-CM | POA: Insufficient documentation

## 2013-10-23 DIAGNOSIS — F1010 Alcohol abuse, uncomplicated: Secondary | ICD-10-CM | POA: Insufficient documentation

## 2013-10-23 DIAGNOSIS — Z87828 Personal history of other (healed) physical injury and trauma: Secondary | ICD-10-CM | POA: Insufficient documentation

## 2013-10-23 DIAGNOSIS — Z3202 Encounter for pregnancy test, result negative: Secondary | ICD-10-CM | POA: Insufficient documentation

## 2013-10-23 DIAGNOSIS — F17200 Nicotine dependence, unspecified, uncomplicated: Secondary | ICD-10-CM | POA: Insufficient documentation

## 2013-10-23 DIAGNOSIS — F1410 Cocaine abuse, uncomplicated: Secondary | ICD-10-CM | POA: Insufficient documentation

## 2013-10-23 HISTORY — DX: Anxiety disorder, unspecified: F41.9

## 2013-10-23 LAB — COMPREHENSIVE METABOLIC PANEL
ALBUMIN: 4.1 g/dL (ref 3.5–5.2)
ALT: 117 U/L — AB (ref 0–35)
AST: 176 U/L — ABNORMAL HIGH (ref 0–37)
Alkaline Phosphatase: 97 U/L (ref 39–117)
BUN: 5 mg/dL — AB (ref 6–23)
CALCIUM: 9.3 mg/dL (ref 8.4–10.5)
CO2: 20 mEq/L (ref 19–32)
Chloride: 99 mEq/L (ref 96–112)
Creatinine, Ser: 0.71 mg/dL (ref 0.50–1.10)
GFR calc non Af Amer: >90 mL/min (ref >90)
GLUCOSE: 85 mg/dL (ref 70–99)
POTASSIUM: 3.7 meq/L (ref 3.7–5.3)
SODIUM: 139 meq/L (ref 137–147)
TOTAL PROTEIN: 7.8 g/dL (ref 6.0–8.3)
Total Bilirubin: 0.6 mg/dL (ref 0.3–1.2)

## 2013-10-23 LAB — CBC WITH DIFFERENTIAL/PLATELET
BASOS PCT: 1 % (ref 0–1)
Basophils Absolute: 0.1 10*3/uL (ref 0.0–0.1)
EOS ABS: 0.2 10*3/uL (ref 0.0–0.7)
EOS PCT: 3 % (ref 0–5)
HCT: 41.7 % (ref 36.0–46.0)
Hemoglobin: 14.8 g/dL (ref 12.0–15.0)
LYMPHS ABS: 2 10*3/uL (ref 0.7–4.0)
Lymphocytes Relative: 29 % (ref 12–46)
MCH: 34.7 pg — AB (ref 26.0–34.0)
MCHC: 35.5 g/dL (ref 30.0–36.0)
MCV: 97.7 fL (ref 78.0–100.0)
Monocytes Absolute: 0.7 10*3/uL (ref 0.1–1.0)
Monocytes Relative: 11 % (ref 3–12)
NEUTROS PCT: 57 % (ref 43–77)
Neutro Abs: 3.9 10*3/uL (ref 1.7–7.7)
PLATELETS: 189 10*3/uL (ref 150–400)
RBC: 4.27 MIL/uL (ref 3.87–5.11)
RDW: 12.7 % (ref 11.5–15.5)
WBC: 6.9 10*3/uL (ref 4.0–10.5)

## 2013-10-23 LAB — POC URINE PREG, ED: PREG TEST UR: NEGATIVE

## 2013-10-23 LAB — RAPID URINE DRUG SCREEN, HOSP PERFORMED
AMPHETAMINES: NOT DETECTED
BENZODIAZEPINES: NOT DETECTED
Barbiturates: NOT DETECTED
COCAINE: POSITIVE — AB
OPIATES: NOT DETECTED
TETRAHYDROCANNABINOL: POSITIVE — AB

## 2013-10-23 LAB — ETHANOL: ALCOHOL ETHYL (B): 266 mg/dL — AB (ref 0–11)

## 2013-10-23 MED ORDER — LORAZEPAM 1 MG PO TABS: 0.0000 mg | ORAL_TABLET | Freq: Two times a day (BID) | ORAL | Status: DC

## 2013-10-23 MED ORDER — LORAZEPAM 1 MG PO TABS
1.0000 mg | ORAL_TABLET | Freq: Three times a day (TID) | ORAL | Status: DC | PRN
  Administered 2013-10-23: 1 mg via ORAL
  Filled 2013-10-23: qty 1

## 2013-10-23 MED ORDER — ONDANSETRON HCL 4 MG PO TABS
4.0000 mg | ORAL_TABLET | Freq: Three times a day (TID) | ORAL | Status: DC | PRN
  Administered 2013-10-24: 4 mg via ORAL
  Filled 2013-10-23: qty 1

## 2013-10-23 MED ORDER — IBUPROFEN 200 MG PO TABS
600.0000 mg | ORAL_TABLET | Freq: Three times a day (TID) | ORAL | Status: DC | PRN
  Administered 2013-10-23: 600 mg via ORAL
  Filled 2013-10-23: qty 3

## 2013-10-23 MED ORDER — NICOTINE 21 MG/24HR TD PT24
21.0000 mg | MEDICATED_PATCH | Freq: Every day | TRANSDERMAL | Status: DC
  Administered 2013-10-23: 21 mg via TRANSDERMAL
  Filled 2013-10-23: qty 1

## 2013-10-23 MED ORDER — ACETAMINOPHEN 325 MG PO TABS: 650.0000 mg | ORAL_TABLET | ORAL | Status: DC | PRN

## 2013-10-23 MED ORDER — THIAMINE HCL 100 MG/ML IJ SOLN: 100.0000 mg | Freq: Every day | INTRAMUSCULAR | Status: DC

## 2013-10-23 MED ORDER — ZOLPIDEM TARTRATE 5 MG PO TABS
5.0000 mg | ORAL_TABLET | Freq: Every evening | ORAL | Status: DC | PRN
  Administered 2013-10-23: 5 mg via ORAL
  Filled 2013-10-23: qty 1

## 2013-10-23 MED ORDER — ALUM & MAG HYDROXIDE-SIMETH 200-200-20 MG/5ML PO SUSP
30.0000 mL | ORAL | Status: DC | PRN
Start: 2013-10-23 — End: 2013-10-24

## 2013-10-23 MED ORDER — VITAMIN B-1 100 MG PO TABS
100.0000 mg | ORAL_TABLET | Freq: Every day | ORAL | Status: DC
  Administered 2013-10-23: 100 mg via ORAL
  Filled 2013-10-23: qty 1

## 2013-10-23 MED ORDER — LORAZEPAM 1 MG PO TABS
0.0000 mg | ORAL_TABLET | Freq: Four times a day (QID) | ORAL | Status: DC
  Administered 2013-10-24: 1 mg via ORAL
  Administered 2013-10-24: 2 mg via ORAL
  Filled 2013-10-23: qty 1
  Filled 2013-10-23: qty 2

## 2013-10-23 NOTE — ED Provider Notes (Signed)
CSN: [id number]     Arrival date & time 10/23/13  1909 History   First MD Initiated Contact with Patient 10/23/13 2002     Chief Complaint  Patient presents with  . Drug / Alcohol Assessment     (Consider location/radiation/quality/duration/timing/severity/associated sxs/prior Treatment) Patient is a 33 y.o. female presenting with drug/alcohol assessment. The history is provided by the patient and medical records.  Drug / Alcohol Assessment  This is a 33 year old female with past medical history significant for alcohol abuse, presenting to the ED requesting detox from alcohol. Patient states she drinks as much as one fifth of liquor or 12+ beers daily and has been doing this for the past 10 years since the death of her father. She states drinking was a way to escape her problems and avoid stress.  States now her drinking is causing problems in her personal life and is driving her loved ones away. States she feels like "I'm just wasting my life away drinking".  She does admit to occasional marijuana and cocaine use, denies recent use.  Last drink was today, drank 4 40oz beers.  Patient has never completed an inpatient detox program.  States she has tried to cut back on her drinking at home but get shaking and begins vomiting.  No prior seizures.  Denies SI/HI/AVH.  Past Medical History  Diagnosis Date  . History of ETOH abuse   . Laceration of digital nerve of finger     right small finger   History reviewed. No pertinent past surgical history. No family history on file. History  Substance Use Topics  . Smoking status: Current Every Day Smoker -- 0.50 packs/day    Types: Cigarettes  . Smokeless tobacco: Never Used  . Alcohol Use: 60.0 oz/week    100 Cans of beer per week     Comment: daily, heavy at times pt states 4-5 40oz at a time   OB History   Grav Para Term Preterm Abortions TAB SAB Ect Mult Living                 Review of Systems  Psychiatric/Behavioral:       Detox   All other systems reviewed and are negative.     Allergies  Sulfur  Home Medications   Prior to Admission medications   Not on File   BP 148/102  Pulse 82  Temp(Src) 98.2 F (36.8 C) (Oral)  Resp 18  SpO2 100%  LMP 09/03/2013  Physical Exam  Nursing note and vitals reviewed. Constitutional: She is oriented to person, place, and time. She appears well-developed and well-nourished. No distress.  HENT:  Head: Normocephalic and atraumatic.  Mouth/Throat: Oropharynx is clear and moist.  Eyes: Conjunctivae and EOM are normal. Pupils are equal, round, and reactive to light.  Neck: Normal range of motion.  Cardiovascular: Normal rate, regular rhythm and normal heart sounds.   Pulmonary/Chest: Effort normal and breath sounds normal. No respiratory distress. She has no wheezes.  Abdominal: Soft. Bowel sounds are normal. There is no tenderness. There is no guarding.  Musculoskeletal: Normal range of motion. She exhibits no edema.  Neurological: She is alert and oriented to person, place, and time. She displays no tremor. She displays no seizure activity.  No tremors or seizure activity  Skin: Skin is warm and dry. She is not diaphoretic.  Psychiatric: Her mood appears anxious. She is not actively hallucinating. Thought content is not delusional. She expresses no homicidal and no suicidal ideation. She expresses no  suicidal plans and no homicidal plans.  Anxious and tearful, denies SI/HI/AVH    ED Course  Procedures (including critical care time) Labs Review Labs Reviewed  URINE RAPID DRUG SCREEN (HOSP PERFORMED) - Abnormal; Notable for the following:    Cocaine POSITIVE (*)    Tetrahydrocannabinol POSITIVE (*)    All other components within normal limits  CBC WITH DIFFERENTIAL - Abnormal; Notable for the following:    MCH 34.7 (*)    All other components within normal limits  COMPREHENSIVE METABOLIC PANEL - Abnormal; Notable for the following:    BUN 5 (*)    AST 176 (*)     ALT 117 (*)    All other components within normal limits  ETHANOL - Abnormal; Notable for the following:    Alcohol, Ethyl (B) 266 (*)    All other components within normal limits  POC URINE PREG, ED    Imaging Review No results found.   EKG Interpretation None      MDM   Final diagnoses:  Alcohol abuse   33 y.o. F with chronic daily alcohol use for the past 10 years presenting today requesting detox. On exam she is in no acute distress.  She has no tremors or seizure activity, is not currently vomiting, and does not appear to be in acute withdrawal.  Labs obtained as above.  Ethanol 266, UDS + for cocaine and THC.  Pt medically cleared.  Pt placed on CIWA protocol and is awaiting TTS evaluation.  Holding orders placed.  Garlon HatchetLisa M Eland Lamantia, PA-C 10/24/13 [phone]

## 2013-10-23 NOTE — ED Notes (Signed)
Patient denies SI, HI, AVH at present. Rates anxiety 10/10, depression 2/10. Patient is hopefull and happy that she decided to get help for her ETOH problem. States "I drank beer before coming to the hospital to stop the shakes and anxiety". Reports trouble sleeping and poor appetite.  Encouragement offered. Given Nicoderm, B1, Gatorade, Water.  Patient safety maintained, Q 15 checks continue.

## 2013-10-23 NOTE — ED Notes (Signed)
Pt states that she has been drinking for "awhile" and is requesting detox from ETOH; pt states that she is feeling shakey and nauseous trying to detox herself; pt denies SI / HI; pt states "I am just trying to quit and I need help"

## 2013-10-24 ENCOUNTER — Inpatient Hospital Stay (HOSPITAL_COMMUNITY)
Admission: EM | Admit: 2013-10-24 | Discharge: 2013-10-29 | DRG: 897 | Disposition: A | Discharge status: Home / Self Care | Payer: Federal, State, Local not specified - Other | Source: Intra-hospital | Attending: Psychiatry | Admitting: Psychiatry

## 2013-10-24 ENCOUNTER — Encounter (HOSPITAL_COMMUNITY): Payer: Self-pay | Admitting: *Deleted

## 2013-10-24 DIAGNOSIS — Z598 Other problems related to housing and economic circumstances: Secondary | ICD-10-CM

## 2013-10-24 DIAGNOSIS — I10 Essential (primary) hypertension: Secondary | ICD-10-CM | POA: Diagnosis present

## 2013-10-24 DIAGNOSIS — G4700 Insomnia, unspecified: Secondary | ICD-10-CM | POA: Diagnosis present

## 2013-10-24 DIAGNOSIS — F1994 Other psychoactive substance use, unspecified with psychoactive substance-induced mood disorder: Secondary | ICD-10-CM | POA: Diagnosis present

## 2013-10-24 DIAGNOSIS — F17200 Nicotine dependence, unspecified, uncomplicated: Secondary | ICD-10-CM | POA: Diagnosis present

## 2013-10-24 DIAGNOSIS — F1020 Alcohol dependence, uncomplicated: Principal | ICD-10-CM | POA: Diagnosis present

## 2013-10-24 DIAGNOSIS — F411 Generalized anxiety disorder: Secondary | ICD-10-CM | POA: Diagnosis present

## 2013-10-24 DIAGNOSIS — Z5987 Material hardship due to limited financial resources, not elsewhere classified: Secondary | ICD-10-CM

## 2013-10-24 DIAGNOSIS — F321 Major depressive disorder, single episode, moderate: Secondary | ICD-10-CM | POA: Diagnosis present

## 2013-10-24 MED ORDER — CHLORDIAZEPOXIDE HCL 25 MG PO CAPS
25.0000 mg | ORAL_CAPSULE | Freq: Three times a day (TID) | ORAL | Status: AC
  Administered 2013-10-25 (×3): 25 mg via ORAL
  Filled 2013-10-24 (×3): qty 1

## 2013-10-24 MED ORDER — CHLORDIAZEPOXIDE HCL 25 MG PO CAPS
25.0000 mg | ORAL_CAPSULE | Freq: Four times a day (QID) | ORAL | Status: AC | PRN
  Administered 2013-10-25: 25 mg via ORAL
  Filled 2013-10-24: qty 1

## 2013-10-24 MED ORDER — THIAMINE HCL 100 MG/ML IJ SOLN
100.0000 mg | Freq: Once | INTRAMUSCULAR | Status: AC
  Administered 2013-10-24: 100 mg via INTRAMUSCULAR
  Filled 2013-10-24: qty 2

## 2013-10-24 MED ORDER — TRAZODONE HCL 50 MG PO TABS
50.0000 mg | ORAL_TABLET | Freq: Every evening | ORAL | Status: DC | PRN
  Administered 2013-10-24 – 2013-10-25 (×2): 50 mg via ORAL
  Filled 2013-10-24 (×2): qty 1

## 2013-10-24 MED ORDER — ADULT MULTIVITAMIN W/MINERALS CH
1.0000 | ORAL_TABLET | Freq: Every day | ORAL | Status: DC
  Administered 2013-10-24 – 2013-10-29 (×6): 1 via ORAL
  Filled 2013-10-24 (×9): qty 1

## 2013-10-24 MED ORDER — LOPERAMIDE HCL 2 MG PO CAPS
2.0000 mg | ORAL_CAPSULE | ORAL | Status: AC | PRN
Start: 2013-10-24 — End: 2013-10-27

## 2013-10-24 MED ORDER — CHLORDIAZEPOXIDE HCL 25 MG PO CAPS
25.0000 mg | ORAL_CAPSULE | Freq: Four times a day (QID) | ORAL | Status: AC
  Administered 2013-10-24 (×3): 25 mg via ORAL
  Filled 2013-10-24 (×3): qty 1

## 2013-10-24 MED ORDER — CHLORDIAZEPOXIDE HCL 25 MG PO CAPS
25.0000 mg | ORAL_CAPSULE | Freq: Every day | ORAL | Status: AC
  Administered 2013-10-27: 25 mg via ORAL

## 2013-10-24 MED ORDER — CHLORDIAZEPOXIDE HCL 25 MG PO CAPS
25.0000 mg | ORAL_CAPSULE | ORAL | Status: AC
  Administered 2013-10-26 (×2): 25 mg via ORAL
  Filled 2013-10-24 (×2): qty 1

## 2013-10-24 MED ORDER — ACETAMINOPHEN 325 MG PO TABS
650.0000 mg | ORAL_TABLET | Freq: Four times a day (QID) | ORAL | Status: DC | PRN
  Administered 2013-10-26 – 2013-10-28 (×4): 650 mg via ORAL
  Filled 2013-10-24 (×5): qty 2

## 2013-10-24 MED ORDER — ONDANSETRON 4 MG PO TBDP
4.0000 mg | ORAL_TABLET | Freq: Four times a day (QID) | ORAL | Status: AC | PRN
  Administered 2013-10-24: 4 mg via ORAL
  Filled 2013-10-24: qty 1

## 2013-10-24 MED ORDER — NICOTINE 21 MG/24HR TD PT24
21.0000 mg | MEDICATED_PATCH | Freq: Every day | TRANSDERMAL | Status: DC
  Administered 2013-10-24 – 2013-10-29 (×6): 21 mg via TRANSDERMAL
  Filled 2013-10-24 (×5): qty 1
  Filled 2013-10-24: qty 14
  Filled 2013-10-24 (×3): qty 1

## 2013-10-24 MED ORDER — ALUM & MAG HYDROXIDE-SIMETH 200-200-20 MG/5ML PO SUSP: 30.0000 mL | ORAL | Status: DC | PRN

## 2013-10-24 MED ORDER — VITAMIN B-1 100 MG PO TABS
100.0000 mg | ORAL_TABLET | Freq: Every day | ORAL | Status: DC
  Administered 2013-10-25 – 2013-10-29 (×5): 100 mg via ORAL
  Filled 2013-10-24 (×7): qty 1

## 2013-10-24 MED ORDER — MAGNESIUM HYDROXIDE 400 MG/5ML PO SUSP
30.0000 mL | Freq: Every day | ORAL | Status: DC | PRN
Start: 2013-10-24 — End: 2013-10-29

## 2013-10-24 MED ORDER — HYDROXYZINE HCL 25 MG PO TABS
25.0000 mg | ORAL_TABLET | Freq: Four times a day (QID) | ORAL | Status: AC | PRN
  Administered 2013-10-24: 25 mg via ORAL
  Filled 2013-10-24: qty 1

## 2013-10-24 NOTE — [facility] Counselor (Signed)
Per Aundra MilletMegan R(RN), states that pt is too drowsy to be transported [facility] at [facility]. Pt was given medication earlier to help with restlessness and anxiety.  Pt can be transported after 8AM.

## 2013-10-24 NOTE — [facility] Counselor (Signed)
Pt accepted by spencer simon, pa; [phone]

## 2013-10-24 NOTE — Tx Team (Signed)
Initial Interdisciplinary Treatment Plan  PATIENT STRENGTHS: (choose at least two) Ability for insight Average or above average intelligence Capable of independent living Motivation for treatment/growth Supportive family/friends  PATIENT STRESSORS: Financial difficulties Substance abuse   PROBLEM LIST: Problem List/Patient Goals Date to be addressed Date deferred Reason deferred Estimated date of resolution  "get off the alcohol"      "get a job"                                                 DISCHARGE CRITERIA:  Ability to meet basic life and health needs Improved stabilization in mood, thinking, and/or behavior Motivation to continue treatment in a less acute level of care Need for constant or close observation no longer present Verbal commitment to aftercare and medication compliance Withdrawal symptoms are absent or subacute and managed without 24-hour nursing intervention  PRELIMINARY DISCHARGE PLAN: Attend aftercare/continuing care group Outpatient therapy  PATIENT/FAMIILY INVOLVEMENT: This treatment plan has been presented to and reviewed with the patient, Shelly Tucker.  The patient has been given the opportunity to ask questions and make suggestions.  Shelly Tucker 10/24/2013, 2:03 PM

## 2013-10-24 NOTE — ED Notes (Signed)
Patient drowsy upon arousal. Respirations even, unlabored. Unable to answer questions, get up at this time. TTS notified.  Patient safety maintained, Q 15 checks continue.

## 2013-10-24 NOTE — BH Assessment (Signed)
Tele Assessment Note   Shelly Tucker is a 33 y.o. female who voluntarily [facility] for alcohol, cocaine and THC.  Pt denies SI/HI/AVH.  Pt reports the following: she consumes a 12 pack and 1 pint of liquor, daily. Pt last use was 10/23/13, pt drank 4-40's.  Pt uses 2-3 lines on the weekends, last was last week and pt used 1 gram of cocaine.  She also smokes 2-3 blunts of marijuana on the weekends. Pt says she used last week and smoke 4-5 bowls.  Pt has no previous detox treatment.  She denies seizures of blackouts.  Pt c/o w/d sxs: shakes, anxiety, chills and nausea.    Axis I: Alcohol use disorder, Severe; Cocaine use disorder, Moderate; Cannabis use disorder, Moderate Axis II: Deferred Axis III:  Past Medical History  Diagnosis Date  . History of ETOH abuse   . Laceration of digital nerve of finger     right small finger  . Anxiety    Axis IV: other psychosocial or environmental problems, problems related to social environment and problems with primary support group Axis V: 41-50 serious symptoms  Past Medical History:  Past Medical History  Diagnosis Date  . History of ETOH abuse   . Laceration of digital nerve of finger     right small finger  . Anxiety     History reviewed. No pertinent past surgical history.  Family History: No family history on file.  Social History:  reports that she has been smoking Cigarettes.  She has been smoking about 0.50 packs per day. She has never used smokeless tobacco. She reports that she drinks about 60 ounces of alcohol per week. She reports that she uses illicit drugs (Cocaine).  Additional Social History:  Alcohol / Drug Use Pain Medications: None  Prescriptions: None  Over the Counter: None  History of alcohol / drug use?: Yes Longest period of sobriety (when/how long): None  Negative Consequences of Use: Work / School;Personal relationships;Financial Withdrawal Symptoms: Fever / Chills;Nausea / Vomiting;Tremors;Other (Comment)  Management consultant(Jittery ) Substance #1 Name of Substance 1: Alcohol  1 - Age of First Use: Teens  1 - Amount (size/oz): 12pk, 1 Pint  1 - Frequency: Daily  1 - Duration: On-going  1 - Last Use / Amount: 10/23/13 Substance #2 Name of Substance 2: Cocaine  2 - Age of First Use: 20's  2 - Amount (size/oz): 2-3 Lines  2 - Frequency: Weekends 2 - Duration: On-going  2 - Last Use / Amount: Last Weekend  Substance #3 Name of Substance 3: THC  3 - Age of First Use: Teens  3 - Amount (size/oz): 2 Blunts  3 - Frequency: Weekends  3 - Duration: On-going  3 - Last Use / Amount: Last Weekend   CIWA: CIWA-Ar BP: 146/99 mmHg Pulse Rate: 71 Nausea and Vomiting: mild nausea with no vomiting Tactile Disturbances: none Tremor: not visible, but can be felt fingertip to fingertip Auditory Disturbances: not present Paroxysmal Sweats: barely perceptible sweating, palms moist Visual Disturbances: not present Anxiety: moderately anxious, or guarded, so anxiety is inferred Headache, Fullness in Head: none present Agitation: two Orientation and Clouding of Sensorium: oriented and can do serial additions CIWA-Ar Total: 9 COWS:    Allergies:  Allergies  Allergen Reactions  . Sulfur Itching    Home Medications:  (Not in a hospital admission)  OB/GYN Status:  Patient's last menstrual period was 09/03/2013.  General Assessment Data Location of Assessment: [facility] Is this a Tele or  Face-to-Face Assessment?: Face-to-Face Is this an Initial Assessment or a Re-assessment for this encounter?: Initial Assessment Living Arrangements: Spouse/significant other (Lives with spouse ) Can pt return to current living arrangement?: Yes Admission Status: Voluntary Is patient capable of signing voluntary admission?: Yes Transfer from: Acute Hospital Referral Source: MD  Medical Screening Exam [facility] Walk-in ONLY) Medical Exam completed: No Reason for MSE not completed: Other: (None )  [facility] Crisis Care Plan Living  Arrangements: Spouse/significant other (Lives with spouse ) Name of Psychiatrist: None  Name of Therapist: None   Education Status Is patient currently in school?: No Current Grade: None  Highest grade of school patient has completed: None  Name of school: None  Contact person: None   Risk to self Suicidal Ideation: No Suicidal Intent: No Is patient at risk for suicide?: No Suicidal Plan?: No Access to Means: No What has been your use of drugs/alcohol within the last 12 months?: Abusing: alcohol, cocaine, THC  Previous Attempts/Gestures: No How many times?: 0 Other Self Harm Risks: None  Triggers for Past Attempts: None known Intentional Self Injurious Behavior: None Family Suicide History: No Recent stressful life event(s): Other (Comment) (Chronic SA ) Persecutory voices/beliefs?: No Depression: Yes Depression Symptoms: Loss of interest in usual pleasures Substance abuse history and/or treatment for substance abuse?: Yes Suicide prevention information given to non-admitted patients: Not applicable  Risk to Others Homicidal Ideation: No Thoughts of Harm to Others: No Current Homicidal Intent: No Current Homicidal Plan: No Access to Homicidal Means: No Identified Victim: None  History of harm to others?: No Assessment of Violence: None Noted Violent Behavior Description: None  Does patient have access to weapons?: No Criminal Charges Pending?: Yes Describe Pending Criminal Charges: Seatbelt  Does patient have a court date: Yes Court Date: 11/19/13  Psychosis Hallucinations: None noted Delusions: None noted  Mental Status Report Appear/Hygiene: Disheveled Eye Contact: Good Motor Activity: Restlessness Speech: Logical/coherent Level of Consciousness: Alert Mood: Other (Comment) (Appropriate ) Affect: Appropriate to circumstance Anxiety Level: None Thought Processes: Coherent;Relevant Judgement: Unimpaired Orientation: Person;Place;Time;Situation Obsessive  Compulsive Thoughts/Behaviors: None  Cognitive Functioning Concentration: Normal Memory: Recent Intact;Remote Intact IQ: Average Insight: Fair Impulse Control: Fair Appetite: Good Weight Loss: 0 Weight Gain: 0 Sleep: No Change Total Hours of Sleep: 5 Vegetative Symptoms: None  ADLScreening [facility] Assessment Services) Patient's cognitive ability adequate to safely complete daily activities?: Yes Patient able to express need for assistance with ADLs?: Yes Independently performs ADLs?: Yes (appropriate for developmental age)  Prior Inpatient Therapy Prior Inpatient Therapy: No Prior Therapy Dates: None  Prior Therapy Facilty/Provider(s): None  Reason for Treatment: None   Prior Outpatient Therapy Prior Outpatient Therapy: No Prior Therapy Dates: None  Prior Therapy Facilty/Provider(s): None  Reason for Treatment: None   ADL Screening (condition at time of admission) Patient's cognitive ability adequate to safely complete daily activities?: Yes Is the patient deaf or have difficulty hearing?: No Does the patient have difficulty seeing, even when wearing glasses/contacts?: No Does the patient have difficulty concentrating, remembering, or making decisions?: No Patient able to express need for assistance with ADLs?: Yes Does the patient have difficulty dressing or bathing?: No Independently performs ADLs?: Yes (appropriate for developmental age) Does the patient have difficulty walking or climbing stairs?: No Weakness of Legs: None Weakness of Arms/Hands: None  Home Assistive Devices/Equipment Home Assistive Devices/Equipment: None  Therapy Consults (therapy consults require a physician order) PT Evaluation Needed: No OT Evalulation Needed: No SLP Evaluation Needed: No Abuse/Neglect Assessment (Assessment to be complete while  patient is alone) Physical Abuse: Denies Verbal Abuse: Denies Sexual Abuse: Denies Exploitation of patient/patient's resources:  Denies Self-Neglect: Denies Values / Beliefs Cultural Requests During Hospitalization: None Spiritual Requests During Hospitalization: None Consults Spiritual Care Consult Needed: No Social Work Consult Needed: No Merchant navy officerAdvance Directives (For Healthcare) Advance Directive: Patient does not have advance directive;Patient would not like information Pre-existing out of facility DNR order (yellow form or pink MOST form): No Nutrition Screen- [facility] Patient's home diet: Regular  Additional Information 1:1 In Past 12 Months?: No CIRT Risk: No Elopement Risk: No Does patient have medical clearance?: Yes     Disposition:  Disposition Initial Assessment Completed for this Encounter: Yes Disposition of Patient: Inpatient treatment program;Referred to (Accepted by Donell SievertSpencer Simon, [place]; [phone]) Type of inpatient treatment program: Adult Patient referred to: Other (Comment) (Accepted by Donell SievertSpencer Simon, [place]; [phone])  Shelly Reddeneresa C Delmar Arriaga 10/24/2013 12:45 AM

## 2013-10-24 NOTE — Progress Notes (Signed)
D: Patient in the hallway on approach.  Patient states, "I am not as shaky as she was."  Patient states she is getting adjusted to the unit.  Patient denies SI/HI and denies AVH.   A: Staff to monitor Q 15 mins for safety.  Encouragement and support offered.  Scheduled medications administered per orders.  Vistaril administered prn.  Trazodone administered prn R: Patient remains safe on the unit.  Patient attended group tonight.  Patient visible on the unit and interacting with peers.  Patient taking administered medictions.

## 2013-10-24 NOTE — ED Notes (Signed)
Dr Dierdre Highmanpitz aware patient too drowsy for transfer at this time.

## 2013-10-24 NOTE — Progress Notes (Signed)
[facility]30 for detox from alcohol, cocaine and THC.  She does not have any acute problems or past medical except her right forearm scared from "putting my hand through a plate glass"  She does admit to using alcohol for past 3 years and drinking off and on for past 10 years.  She stated "I only use cocaine and marijuana on the week-ends"  She denies any S/H ideation or any A/V/H.  Her CIWA was a 4 on admission.  She was shown the unit and discussed rules from her treatment agreement.

## 2013-10-24 NOTE — Progress Notes (Signed)
D:Pt has been in bed much of the evening. She presents with mild tremors and rates anxiety as an 8 on 1-10 scale with 10 being the most anxious. A:Offered support, encouragement and 15 minute checks.  R:Pt denies si and hi. Safety maintained on the unit.

## 2013-10-24 NOTE — ED Provider Notes (Signed)
Medical screening examination/treatment/procedure(s) were performed by non-physician practitioner and as supervising physician I was immediately available for consultation/collaboration.   EKG Interpretation None        Courtney F Horton, MD 10/24/13 1859 

## 2013-10-24 NOTE — H&P (Signed)
Psychiatric Admission Assessment Adult  Patient Identification:  Shelly Tucker Date of Evaluation:  10/24/2013 Chief Complaint:  Alcohol Dependence History of Present Illness:: [name] who comes requesting help with her alcohol abuse. She drinks almotst every day and has been doing so for the last 10 years. Can drink a fifth or a 12 pack plus of beer. She admits to occasional use of cocaine and marijuana. States she started drinking after her father died. She used alcohol to deal with her feelings and she admits that  now she is dependent on alcohol for sleep and being able to funcion. She experiences withdrawal when she does not drink. She states she is realizing it has caused her a lot. She is losing more and more because of it  Associated Signs/Synptoms: Depression Symptoms:  depressed mood, loss of energy/fatigue, disturbed sleep, (Hypo) Manic Symptoms:  Labiality of Mood, Anxiety Symptoms:  Excessive Worry, Psychotic Symptoms:  denies PTSD Symptoms: Negative Total Time spent with patient: 45 minutes  Psychiatric Specialty Exam: Physical Exam  Review of Systems  Constitutional: Negative.   HENT: Negative.   Eyes: Negative.   Respiratory:       Smoke one and 1/2 pack a day  Cardiovascular: Negative.        Smokes one and 1/2 pack per day  Gastrointestinal: Negative.   Genitourinary: Negative.   Musculoskeletal: Positive for back pain.  Skin: Negative.   Neurological: Negative.   Endo/Heme/Allergies: Negative.   Psychiatric/Behavioral: Positive for substance abuse. The patient is nervous/anxious.     Blood pressure 137/94, pulse 97, temperature 97.8 F (36.6 C), temperature source Oral, resp. rate 16, height '5\' 5"'  (1.651 m), weight 64.864 kg (143 lb), last menstrual period 09/03/2013, SpO2 98.00%.Body mass index is 23.8 kg/(m^2).  General Appearance: Disheveled  Eye Contact::  Minimal  Speech:  Slow and not spontaneous  Volume:  Decreased  Mood:  Depressed and not  feeling well, tired  Affect:  Restricted  Thought Process:  Coherent and Goal Directed  Orientation:  Full (Time, Place, and Person)  Thought Content:  symptoms, worries, concerns  Suicidal Thoughts:  No  Homicidal Thoughts:  No  Memory:  Immediate;   Fair Recent;   Fair Remote;   Fair  Judgement:  Fair  Insight:  Present and Shallow  Psychomotor Activity:  Decreased  Concentration:  Fair  Recall:  [employer] of Knowledge:NA  Language: Fair  Akathisia:  No  Handed:    AIMS (if indicated):     Assets:  Desire for Improvement Talents/Skills  Sleep:       Musculoskeletal: Strength & Muscle Tone: within normal limits Gait & Station: normal Patient leans: N/A  Past Psychiatric History: Diagnosis:  Hospitalizations: Denies  Outpatient Care:Denies  Substance Abuse Care:Denies  Self-Mutilation:Denies  Suicidal Attempts:Denies  Violent Behaviors:Denies   Past Medical History:   Past Medical History  Diagnosis Date  . History of ETOH abuse   . Laceration of digital nerve of finger     right small finger  . Anxiety     Allergies:   Allergies  Allergen Reactions  . Sulfur Itching   PTA Medications: No prescriptions prior to admission    Previous Psychotropic Medications:  Medication/Dose  Denies               Substance Abuse History in the last 12 months:  yes  Consequences of Substance Abuse: Legal Consequences:  2 DWI Withdrawal Symptoms:   Diaphoresis Tremors  Social History:  reports  that she has been smoking Cigarettes.  She has been smoking about 1.00 pack per day. She has never used smokeless tobacco. She reports that she drinks about 60 ounces of alcohol per week. She reports that she uses illicit drugs (Cocaine and Marijuana). Additional Social History: Pain Medications: None  Prescriptions: None  Over the Counter: None  History of alcohol / drug use?: Yes Longest period of sobriety (when/how long): None  Negative Consequences of Use:  Work / School;Financial;Personal relationships Withdrawal Symptoms: Weakness;Tremors Name of Substance 1: Alcohol  1 - Age of First Use: Teens  1 - Amount (size/oz): 12pk, 1 Pint  1 - Frequency: Daily  1 - Duration: On-going  1 - Last Use / Amount: 10/23/13 Name of Substance 2: Cocaine  2 - Age of First Use: 20's  2 - Amount (size/oz): 2-3 Lines  2 - Frequency: Weekends 2 - Duration: On-going  2 - Last Use / Amount: Last Weekend  Name of Substance 3: THC  3 - Age of First Use: Teens  3 - Amount (size/oz): 2 Blunts  3 - Frequency: Weekends  3 - Duration: On-going  3 - Last Use / Amount: Last Weekend               Current Place of Residence:  Lives with Runner, broadcasting/film/video of Birth:   Family Members: Marital Status:  Single Children: None  Sons:  Daughters: Relationships: Education:  Freight forwarder Problems/Performance: Religious Beliefs/Practices: History of Abuse (Emotional/Phsycial/Sexual) Denies Pensions consultant; Works as Librarian, 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 Legal History: 2 DWI Hobbies/Interests:  Family History:  History reviewed. No pertinent family history.  Results for orders placed during the hospital encounter of 10/23/13 (from the past 72 hour(s))  CBC WITH DIFFERENTIAL     Status: Abnormal   Collection Time    10/23/13  8:25 PM      Result Value Ref Range   WBC 6.9  4.0 - 10.5 K/uL   RBC 4.27  3.87 - 5.11 MIL/uL   Hemoglobin 14.8  12.0 - 15.0 g/dL   HCT 41.7  36.0 - 46.0 %   MCV 97.7  78.0 - 100.0 fL   MCH 34.7 (*) 26.0 - 34.0 pg   MCHC 35.5  30.0 - 36.0 g/dL   RDW 12.7  11.5 - 15.5 %   Platelets 189  150 - 400 K/uL   Neutrophils Relative % 57  43 - 77 %   Neutro Abs 3.9  1.7 - 7.7 K/uL   Lymphocytes Relative 29  12 - 46 %   Lymphs Abs 2.0  0.7 - 4.0 K/uL   Monocytes Relative 11  3 - 12 %   Monocytes Absolute 0.7  0.1 - 1.0 K/uL   Eosinophils Relative 3  0 - 5 %   Eosinophils Absolute 0.2  0.0 -  0.7 K/uL   Basophils Relative 1  0 - 1 %   Basophils Absolute 0.1  0.0 - 0.1 K/uL  COMPREHENSIVE METABOLIC PANEL     Status: Abnormal   Collection Time    10/23/13  8:25 PM      Result Value Ref Range   Sodium 139  137 - 147 mEq/L   Potassium 3.7  3.7 - 5.3 mEq/L   Chloride 99  96 - 112 mEq/L   CO2 20  19 - 32 mEq/L   Glucose, Bld 85  70 - 99 mg/dL   BUN 5 (*) 6 - 23 mg/dL  Creatinine, Ser 0.71  0.50 - 1.10 mg/dL   Calcium 9.3  8.4 - 10.5 mg/dL   Total Protein 7.8  6.0 - 8.3 g/dL   Albumin 4.1  3.5 - 5.2 g/dL   AST 176 (*) 0 - 37 U/L   ALT 117 (*) 0 - 35 U/L   Alkaline Phosphatase 97  39 - 117 U/L   Total Bilirubin 0.6  0.3 - 1.2 mg/dL   GFR calc non Af Amer >90  >90 mL/min   GFR calc Af Amer >90  >90 mL/min   Comment: (NOTE)     The eGFR has been calculated using the CKD EPI equation.     This calculation has not been validated in all clinical situations.     eGFR's persistently <90 mL/min signify possible Chronic Kidney     Disease.  ETHANOL     Status: Abnormal   Collection Time    10/23/13  8:25 PM      Result Value Ref Range   Alcohol, Ethyl (B) 266 (*) 0 - 11 mg/dL   Comment:            LOWEST DETECTABLE LIMIT FOR     SERUM ALCOHOL IS 11 mg/dL     FOR MEDICAL PURPOSES ONLY  URINE RAPID DRUG SCREEN (HOSP PERFORMED)     Status: Abnormal   Collection Time    10/23/13  8:27 PM      Result Value Ref Range   Opiates NONE DETECTED  NONE DETECTED   Cocaine POSITIVE (*) NONE DETECTED   Benzodiazepines NONE DETECTED  NONE DETECTED   Amphetamines NONE DETECTED  NONE DETECTED   Tetrahydrocannabinol POSITIVE (*) NONE DETECTED   Barbiturates NONE DETECTED  NONE DETECTED   Comment:            DRUG SCREEN FOR MEDICAL PURPOSES     ONLY.  IF CONFIRMATION IS NEEDED     FOR ANY PURPOSE, NOTIFY LAB     WITHIN 5 DAYS.                LOWEST DETECTABLE LIMITS     FOR URINE DRUG SCREEN     Drug Class       Cutoff (ng/mL)     Amphetamine      1000     Barbiturate      200      Benzodiazepine   621     Tricyclics       308     Opiates          300     Cocaine          300     THC              50  POC URINE PREG, ED     Status: None   Collection Time    10/23/13  8:29 PM      Result Value Ref Range   Preg Test, Ur NEGATIVE  NEGATIVE   Comment:            THE SENSITIVITY OF THIS     METHODOLOGY IS >24 mIU/mL   Psychological Evaluations:  Assessment:   DSM5:  Schizophrenia Disorders:  none Obsessive-Compulsive Disorders:  None` Trauma-Stressor Disorders:  none Substance/Addictive Disorders:  Alcohol Related Disorder - Severe (303.90) Depressive Disorders:  Major Depressive Disorder - Moderate (296.22)  AXIS I:  Substance Induced Mood Disorder AXIS II:  Deferred AXIS III:   Past Medical History  Diagnosis  Date  . History of ETOH abuse   . Laceration of digital nerve of finger     right small finger  . Anxiety    AXIS IV:  other psychosocial or environmental problems AXIS V:  41-50 serious symptoms  Treatment Plan/Recommendations:  Supportive approach/coping skills/relapse prevention                                                                 [facility] detox/reassess and address the co morbidities  Treatment Plan Summary: Daily contact with patient to assess and evaluate symptoms and progress in treatment Medication management Current Medications:  No current facility-administered medications for this encounter.    Observation Level/Precautions:  15 minute checks  Laboratory:  As per the ED  Psychotherapy:  Individual/group  Medications:  [facility] detox/reassess for psychotropics  Consultations:    Discharge Concerns:    Estimated LOS: 3-5 days  Other:     I certify that inpatient services furnished can reasonably be expected to improve the patient's condition.   Nicholaus Bloom 4/22/201512:46 PM

## 2013-10-24 NOTE — Progress Notes (Signed)
[facility] provided pt with a list of primary care resources and [employer] Card application, highlighting [facility], to help patient establish primary care.

## 2013-10-24 NOTE — [facility] Group Notes (Signed)
[facility] LCSW Group Therapy  10/24/2013 1:15 PM   Type of Therapy:  Group Therapy  Participation Level:  Did Not Attend - pt sleeping in her room  Shelly IvanChelsea Horton, LCSW 10/24/2013 2:24 PM

## 2013-10-25 DIAGNOSIS — F1020 Alcohol dependence, uncomplicated: Principal | ICD-10-CM

## 2013-10-25 DIAGNOSIS — F320 Major depressive disorder, single episode, mild: Secondary | ICD-10-CM

## 2013-10-25 MED ORDER — LISINOPRIL 20 MG PO TABS
20.0000 mg | ORAL_TABLET | Freq: Once | ORAL | Status: AC
  Administered 2013-10-25: 20 mg via ORAL
  Filled 2013-10-25 (×2): qty 1

## 2013-10-25 MED ORDER — LISINOPRIL 10 MG PO TABS
10.0000 mg | ORAL_TABLET | Freq: Every day | ORAL | Status: DC
Start: 2013-10-26 — End: 2013-10-29
  Administered 2013-10-26 – 2013-10-29 (×4): 10 mg via ORAL
  Filled 2013-10-25: qty 1
  Filled 2013-10-25: qty 14
  Filled 2013-10-25 (×4): qty 1
  Filled 2013-10-25: qty 14

## 2013-10-25 NOTE — Progress Notes (Addendum)
Patient ID: Shelly Tucker, female   DOB: [birth year], 33 y.o.   MRN: [id number] D: Patient reports some withdrawal symptoms.  She reports anxiety, cravings, tremors and some depression.  She states her depressive symptoms have decreased with her detox.  She is attending groups and interacting well with staff and her peers. Patient is more visible on the unit.  Patient continues with the [facility] protocol for her detox.  She denies SI/HI/AVH.  A: continue to monitor medication management and MD orders.  Safety checks completed every 15 minutes per protocol.  R: Patient is receptive to staff; her behavior is appropriate.

## 2013-10-25 NOTE — Progress Notes (Signed)
[facility] MD Progress Note  10/25/2013 11:22 AM Shelly Tucker  MRN:  [id number]  Subjective: Shelly Tucker reports that she is still having some tremors. Is endorsing a lot of energy and rated her anxiety at #4. She says her depression is mild, and it is all in relation to her alcoholism. Says this detoxification treatment is very new to her. Says she is determined to make good of her admission in this hospital. She denies any SIHI, AVH. She is participating in group sessions.   Diagnosis:   DSM5: Schizophrenia Disorders:  NA Obsessive-Compulsive Disorders:  NA Trauma-Stressor Disorders:  NA Substance/Addictive Disorders:  Alcohol Related Disorder - Severe (303.90) Depressive Disorders:  Major Depressive Disorder - Mild (296.21) Total Time spent with patient: 1 hour  Axis I: Alcohol dependence, Major Depressive Disorder - Mild (296.21) Axis II: Deferred Axis III:  Past Medical History  Diagnosis Date  . History of ETOH abuse   . Laceration of digital nerve of finger     right small finger  . Anxiety    Axis IV: other psychosocial or environmental problems and alcoholism Axis V: 41-50 serious symptoms  ADL's:  Impaired  Sleep: Good  Appetite:  Good  Suicidal Ideation:  Plan:  Denies Intent:  Denies Means:  Denies Homicidal Ideation:  Plan:  Denies Intent:  Denies Means:  Denies  AEB (as evidenced by):  Psychiatric Specialty Exam: Physical Exam  Psychiatric: Her speech is normal and behavior is normal. Judgment and thought content normal. Her mood appears anxious. Her affect is not angry, not blunt, not labile and not inappropriate. Cognition and memory are normal. She exhibits a depressed mood.    Review of Systems  Constitutional: Positive for chills and malaise/fatigue.  HENT: Negative.   Eyes: Negative.   Respiratory: Negative.   Cardiovascular: Negative.   Gastrointestinal: Negative.   Genitourinary: Negative.   Musculoskeletal: Negative.   Skin: Negative.    Neurological: Positive for tremors and weakness.  Endo/Heme/Allergies: Negative.   Psychiatric/Behavioral: Positive for depression and substance abuse (Alcoholism). Negative for suicidal ideas, hallucinations and memory loss. The patient is nervous/anxious and has insomnia.     Blood pressure 135/95, pulse 112, temperature 97.5 F (36.4 C), temperature source Oral, resp. rate 17, height '5\' 5"'  (1.651 m), weight 64.864 kg (143 lb), last menstrual period 09/03/2013, SpO2 98.00%.Body mass index is 23.8 kg/(m^2).  General Appearance: Disheveled  Eye Sport and exercise psychologist::  Fair  Speech:  Clear and Coherent  Volume:  Normal  Mood:  Anxious and Depressed  Affect:  Flat  Thought Process:  Coherent and Goal Directed  Orientation:  Full (Time, Place, and Person)  Thought Content:  Rumination  Suicidal Thoughts:  No  Homicidal Thoughts:  No  Memory:  Immediate;   Good Recent;   Good Remote;   Good  Judgement:  Fair  Insight:  Present  Psychomotor Activity:  Tremor  Concentration:  Fair  Recall:  Good  Fund of Knowledge:Fair  Language: Good  Akathisia:  No  Handed:  Right  AIMS (if indicated):     Assets:  Desire for Improvement  Sleep:      Musculoskeletal: Strength & Muscle Tone: within normal limits Gait & Station: normal Patient leans: N/A  Current Medications: Current Facility-Administered Medications  Medication Dose Route Frequency Provider Last Rate Last Dose  . acetaminophen (TYLENOL) tablet 650 mg  650 mg Oral Q6H PRN Nicholaus Bloom, MD      . alum & mag hydroxide-simeth (MAALOX/MYLANTA) 200-200-20 MG/5ML suspension 30 mL  30 mL Oral Q4H PRN Nicholaus Bloom, MD      . chlordiazePOXIDE (LIBRIUM) capsule 25 mg  25 mg Oral Q6H PRN Nicholaus Bloom, MD      . chlordiazePOXIDE (LIBRIUM) capsule 25 mg  25 mg Oral TID Nicholaus Bloom, MD   25 mg at 10/25/13 4734   Followed by  . [START ON 10/26/2013] chlordiazePOXIDE (LIBRIUM) capsule 25 mg  25 mg Oral BH-qamhs Nicholaus Bloom, MD       Followed by   . [START ON 10/27/2013] chlordiazePOXIDE (LIBRIUM) capsule 25 mg  25 mg Oral Daily Nicholaus Bloom, MD      . hydrOXYzine (ATARAX/VISTARIL) tablet 25 mg  25 mg Oral Q6H PRN Nicholaus Bloom, MD   25 mg at 10/24/13 2143  . [START ON 10/26/2013] lisinopril (PRINIVIL,ZESTRIL) tablet 10 mg  10 mg Oral Daily Encarnacion Slates, NP      . lisinopril (PRINIVIL,ZESTRIL) tablet 20 mg  20 mg Oral Once Encarnacion Slates, NP      . loperamide (IMODIUM) capsule 2-4 mg  2-4 mg Oral PRN Nicholaus Bloom, MD      . magnesium hydroxide (MILK OF MAGNESIA) suspension 30 mL  30 mL Oral Daily PRN Nicholaus Bloom, MD      . multivitamin with minerals tablet 1 tablet  1 tablet Oral Daily Nicholaus Bloom, MD   1 tablet at 10/25/13 [phone]  . nicotine (NICODERM CQ - dosed in mg/24 hours) patch 21 mg  21 mg Transdermal Daily Nicholaus Bloom, MD   21 mg at 10/25/13 9643  . ondansetron (ZOFRAN-ODT) disintegrating tablet 4 mg  4 mg Oral Q6H PRN Nicholaus Bloom, MD   4 mg at 10/24/13 1332  . thiamine (VITAMIN B-1) tablet 100 mg  100 mg Oral Daily Nicholaus Bloom, MD   100 mg at 10/25/13 8381  . traZODone (DESYREL) tablet 50 mg  50 mg Oral QHS PRN Nicholaus Bloom, MD   50 mg at 10/24/13 2143    Lab Results:  Results for orders placed during the hospital encounter of 10/23/13 (from the past 48 hour(s))  CBC WITH DIFFERENTIAL     Status: Abnormal   Collection Time    10/23/13  8:25 PM      Result Value Ref Range   WBC 6.9  4.0 - 10.5 K/uL   RBC 4.27  3.87 - 5.11 MIL/uL   Hemoglobin 14.8  12.0 - 15.0 g/dL   HCT 41.7  36.0 - 46.0 %   MCV 97.7  78.0 - 100.0 fL   MCH 34.7 (*) 26.0 - 34.0 pg   MCHC 35.5  30.0 - 36.0 g/dL   RDW 12.7  11.5 - 15.5 %   Platelets 189  150 - 400 K/uL   Neutrophils Relative % 57  43 - 77 %   Neutro Abs 3.9  1.7 - 7.7 K/uL   Lymphocytes Relative 29  12 - 46 %   Lymphs Abs 2.0  0.7 - 4.0 K/uL   Monocytes Relative 11  3 - 12 %   Monocytes Absolute 0.7  0.1 - 1.0 K/uL   Eosinophils Relative 3  0 - 5 %   Eosinophils Absolute 0.2   0.0 - 0.7 K/uL   Basophils Relative 1  0 - 1 %   Basophils Absolute 0.1  0.0 - 0.1 K/uL  COMPREHENSIVE METABOLIC PANEL     Status: Abnormal   Collection Time  10/23/13  8:25 PM      Result Value Ref Range   Sodium 139  137 - 147 mEq/L   Potassium 3.7  3.7 - 5.3 mEq/L   Chloride 99  96 - 112 mEq/L   CO2 20  19 - 32 mEq/L   Glucose, Bld 85  70 - 99 mg/dL   BUN 5 (*) 6 - 23 mg/dL   Creatinine, Ser 0.71  0.50 - 1.10 mg/dL   Calcium 9.3  8.4 - 10.5 mg/dL   Total Protein 7.8  6.0 - 8.3 g/dL   Albumin 4.1  3.5 - 5.2 g/dL   AST 176 (*) 0 - 37 U/L   ALT 117 (*) 0 - 35 U/L   Alkaline Phosphatase 97  39 - 117 U/L   Total Bilirubin 0.6  0.3 - 1.2 mg/dL   GFR calc non Af Amer >90  >90 mL/min   GFR calc Af Amer >90  >90 mL/min   Comment: (NOTE)     The eGFR has been calculated using the CKD EPI equation.     This calculation has not been validated in all clinical situations.     eGFR's persistently <90 mL/min signify possible Chronic Kidney     Disease.  ETHANOL     Status: Abnormal   Collection Time    10/23/13  8:25 PM      Result Value Ref Range   Alcohol, Ethyl (B) 266 (*) 0 - 11 mg/dL   Comment:            LOWEST DETECTABLE LIMIT FOR     SERUM ALCOHOL IS 11 mg/dL     FOR MEDICAL PURPOSES ONLY  URINE RAPID DRUG SCREEN (HOSP PERFORMED)     Status: Abnormal   Collection Time    10/23/13  8:27 PM      Result Value Ref Range   Opiates NONE DETECTED  NONE DETECTED   Cocaine POSITIVE (*) NONE DETECTED   Benzodiazepines NONE DETECTED  NONE DETECTED   Amphetamines NONE DETECTED  NONE DETECTED   Tetrahydrocannabinol POSITIVE (*) NONE DETECTED   Barbiturates NONE DETECTED  NONE DETECTED   Comment:            DRUG SCREEN FOR MEDICAL PURPOSES     ONLY.  IF CONFIRMATION IS NEEDED     FOR ANY PURPOSE, NOTIFY LAB     WITHIN 5 DAYS.                LOWEST DETECTABLE LIMITS     FOR URINE DRUG SCREEN     Drug Class       Cutoff (ng/mL)     Amphetamine      1000     Barbiturate      200      Benzodiazepine   242     Tricyclics       353     Opiates          300     Cocaine          300     THC              50  POC URINE PREG, ED     Status: None   Collection Time    10/23/13  8:29 PM      Result Value Ref Range   Preg Test, Ur NEGATIVE  NEGATIVE   Comment:            THE SENSITIVITY OF  THIS     METHODOLOGY IS >24 mIU/mL    Physical Findings: AIMS: Facial and Oral Movements Muscles of Facial Expression: None, normal Lips and Perioral Area: None, normal Jaw: None, normal Tongue: None, normal,Extremity Movements Upper (arms, wrists, hands, fingers): None, normal Lower (legs, knees, ankles, toes): None, normal, Trunk Movements Neck, shoulders, hips: None, normal, Overall Severity Severity of abnormal movements (highest score from questions above): None, normal Incapacitation due to abnormal movements: None, normal Patient's awareness of abnormal movements (rate only patient's report): No Awareness, Dental Status Current problems with teeth and/or dentures?: No Does patient usually wear dentures?: No  CIWA:  CIWA-Ar Total: 3 COWS:     Treatment Plan Summary: Daily contact with patient to assess and evaluate symptoms and progress in treatment Medication management  Plan: 1. Continue crisis management and stabilization.  2. Medication management: Continue Librium protocols for alcohol dependence, Trazodone 50 mg bedtime for depression/sleep.  3. Encouraged patient to attend groups and participate in group counseling sessions and activities.  4. Discharge plan in progress.  5. Continue current treatment plan.  6. Address health issues: Vitals reviewed and stable, initiated Lisinopril 20 mg once, and 10 mg routinely for HTN.  Medical Decision Making Problem Points:  Review of last therapy session (1) and Review of psycho-social stressors (1) Data Points:  Review of medication regiment & side effects (2) Review of new medications or change in dosage (2)  I  certify that inpatient services furnished can reasonably be expected to improve the patient's condition.   Encarnacion Slates, PMHNP-BC 10/25/2013, 11:22 AM Agree with assessment and plan Geralyn Flash A. Sabra Heck, M.D.

## 2013-10-25 NOTE — [facility] Suicide Risk Assessment (Signed)
[facility] INPATIENT: Family/Significant Other Suicide Prevention Education   Suicide Prevention Education:  Education Completed; No one has been identified by the patient as the family member/significant other with whom the patient will be residing, and identified as the person(s) who will aid the patient in the event of a mental health crisis (suicidal ideations/suicide attempt).   Pt did not c/o SI at admission, nor have they endorsed SI during their stay here. SPE not required. SPI pamphlet provided to pt and he was encouraged to share information with his support network, ask questions, and talk about any concerns.   The suicide prevention education provided includes the following:  Suicide risk factors  Suicide prevention and interventions  National Suicide Hotline telephone number  [facility] assessment telephone number  [facility] Emergency Assistance 911  [facility] and/or [facility] telephone number  Reyes IvanChelsea Horton, [place] 10/25/2013  9:17 AM

## 2013-10-25 NOTE — [facility] Group Notes (Signed)
[facility] LCSW Group Therapy  10/25/2013   1:15 PM   Type of Therapy:  Group Therapy  Participation Level:  Active  Participation Quality:  Attentive, Sharing and Supportive  Affect:  Depressed and Flat  Cognitive:  Alert and Oriented  Insight:  Developing/Improving and Engaged  Engagement in Therapy:  Developing/Improving and Engaged  Modes of Intervention:  Activity, Clarification, Confrontation, Discussion, Education, Exploration, Limit-setting, Orientation, Problem-solving, Rapport Building, Reality Testing, Socialization and Support  Summary of Progress/Problems: Patient was attentive and engaged with speaker from [employer].  Patient was attentive to speaker while they shared their story of dealing with mental health and overcoming it.  Patient expressed interest in their programs and services and received information on their agency.  Patient processed ways they can relate to the speaker.     Ammara Raj Horton, LCSW 10/25/2013  2:30 PM   

## 2013-10-25 NOTE — [facility] Counselor (Signed)
Adult Comprehensive Assessment  Patient ID: Shelly Tucker, female   DOB: 07/28/1980, 33 y.o.   MRN: [id number]  Information Source: Information source: Patient  Current Stressors:  Employment / Job issues: unemployed Family Relationships: family and fiance conflict due to pt's drinking Surveyor, quantityinancial / Lack of resources (include bankruptcy): dependent on fiance Substance abuse: alcohol abuse  Living/Environment/Situation:  Living Arrangements: Spouse/significant other Living conditions (as described by patient or guardian): Pt lives with fiance in [place].  Pt reports that this is a good environment.  How long has patient lived in current situation?: 1 year What is atmosphere in current home: Supportive;Loving;Comfortable  Family History:  Marital status: Long term relationship Long term relationship, how long?: 3 years What types of issues is patient dealing with in the relationship?: arguing due to pt's drinking Additional relationship information: N/A Does patient have children?: No  Childhood History:  By whom was/is the patient raised?: Father;Mother Additional childhood history information: Pt describes her childhood as fair.   Description of patient's relationship with caregiver when they were a child: Pt reports getting along well with parents growing up.  Patient's description of current relationship with people who raised him/her: Father is deceased.  Pt reports having a good relationship with mother today.   Does patient have siblings?: Yes Number of Siblings: 2 Description of patient's current relationship with siblings: Pt reports being close to 2 brothers.   Did patient suffer any verbal/emotional/physical/sexual abuse as a child?: No Did patient suffer from severe childhood neglect?: No Has patient ever been sexually abused/assaulted/raped as an adolescent or adult?: No Was the patient ever a victim of a crime or a disaster?: No Witnessed domestic violence?:  No Has patient been effected by domestic violence as an adult?: No  Education:  Highest grade of school patient has completed: GED Currently a student?: No Name of school: N/A Learning disability?: No  Employment/Work Situation:   Employment situation: Unemployed Patient's job has been impacted by current illness: No What is the longest time patient has a held a job?: 1.5 year Where was the patient employed at that time?: [employer] Has patient ever been in the [employer] and Companymilitary?: No Has patient ever served in Buyer, retailcombat?: No  Financial Resources:   Surveyor, quantityinancial resources: Income from spouse;No income;Food stamps Does patient have a representative payee or guardian?: No  Alcohol/Substance Abuse:   What has been your use of drugs/alcohol within the last 12 months?: Alcohol - 12 pack of beer over 2 days, Marijuana - every other weekend - a couple of bowls, Cocaine - occasional use, last use a few weeks ago If attempted suicide, did drugs/alcohol play a role in this?: No Alcohol/Substance Abuse Treatment Hx: Denies past history If yes, describe treatment: N/A Has alcohol/substance abuse ever caused legal problems?: Yes (2 DWI's in the past)  Social Support System:   Patient's Community Support System: Good Describe Community Support System: Pt reports family and fiance are supportive.  Type of faith/religion: None reported How does patient's faith help to cope with current illness?: N/A  Leisure/Recreation:   Leisure and Hobbies: Sports, listening to music  Strengths/Needs:   What things does the patient do well?: drawing and sports In what areas does patient struggle / problems for patient: Alcohol Abuse  Discharge Plan:   Does patient have access to transportation?: Yes Will patient be returning to same living situation after discharge?: Yes Currently receiving community mental health services: No If no, would patient like referral for services when discharged?: Yes (What [place]?)  (  [facility] [place] [facility] [place] Does patient have financial barriers related to discharge medications?: No  Summary/Recommendations:     Patient is a 33 year old Caucasian Female with a diagnosis of Alcohol use disorder, Severe; Cocaine use disorder, Moderate; Cannabis use disorder, Moderate.  [place] in [place]iance.  Pt reports she is ready to stop drinking due to it negatively impacting her family and fiance relationships.  Patient will benefit from crisis stabilization, medication evaluation, group therapy and psycho education in addition to case management for discharge planning.    Shelly Tucker. 10/25/2013

## 2013-10-25 NOTE — Progress Notes (Signed)
Pt has been up and active in the milieu. She rated her depression and hopelessness a 2 and her anxiety a 4 on her self-inventory.  She denies any S/H ideation or A/V/H. No prn meds required thus far.

## 2013-10-25 NOTE — Progress Notes (Signed)
Patient did attend the evening karaoke group. Pt was attentive and supportive but did not participate by singing a song.   

## 2013-10-25 NOTE — Progress Notes (Signed)
0900 orientation group   The focus of this group is to educate the patient on the purpose and policies of crisis stabilization and provide a format to answer questions about their admission.  The group details unit policies and expectations of patients while admitted.   Pt was an active participant in group her goal today,"stay happy and keep my head up my family makes me happy"

## 2013-10-26 MED ORDER — ZOLPIDEM TARTRATE 10 MG PO TABS
10.0000 mg | ORAL_TABLET | Freq: Every evening | ORAL | Status: DC | PRN
  Administered 2013-10-26 – 2013-10-28 (×3): 10 mg via ORAL
  Filled 2013-10-26 (×3): qty 1

## 2013-10-26 NOTE — Progress Notes (Signed)
D) Pt has been attending the groups and interacts with her peers. Affect is flat and mood depressed. CIWA is a 0. Has been attending the groups and participates openly.  A) Given support, reassurance and praise along with encouragement. Provided with a 1:1 R) Denies SI and HI. Attending the program.

## 2013-10-26 NOTE — Progress Notes (Signed)
Pt observed resting in bed with eyes closed. No acute distress. RR WNL. Level III obs in place for safety and pt is safe. Shelly Tucker Eakes Shavell Nored  

## 2013-10-26 NOTE — Progress Notes (Signed)
(  Discharging early Monday morning, 4/27) [facility] Discharge [facility] :  Will you be returning to the same living situation after discharge: Yes,  Returning home At discharge, do you have transportation home?:Yes,  Friend will pick pt up at 7 AM Do you have the ability to pay for your medications:Yes,  provided pt with samples and prescriptions and [facility]   Release of information consent forms completed and in the chart;  Patient's signature needed at discharge.  Patient to Follow up at: Follow-up Information               Follow up with [facility] on 10/30/13 (Walk in on this date for hospital discharge appointment. Walk in clinic is Monday - Friday 8 am - 3 pm. They will than schedule you for medication management and therapy appts. )    Contact information:   [address] [place], [place] [address] Phone: [phone] Fax: [phone]      Patient denies SI/HI:   Yes,  denies SI/HI    Safety Planning and Suicide Prevention discussed:  Yes,  discussed with pt.  N/A to contact family/friend.  See suicide prevention education note.  See suicide prevention education note.   Shelly Tucker 10/26/2013, 2:55 PM

## 2013-10-26 NOTE — [facility] Group Notes (Signed)
[facility] LCSW Group Therapy  10/26/2013  1:15 PM   Type of Therapy:  Group Therapy  Participation Level:  Active  Participation Quality:  Attentive, Sharing and Supportive  Affect:  Depressed and Flat  Cognitive:  Alert and Oriented  Insight:  Developing/Improving and Engaged  Engagement in Therapy:  Developing/Improving and Engaged  Modes of Intervention:  Clarification, Confrontation, Discussion, Education, Exploration, Limit-setting, Orientation, Problem-solving, Rapport Building, Dance movement psychotherapisteality Testing, Socialization and Support  Summary of Progress/Problems: Group today consisted of processing and education around the stages of change: precontemplation, contemplation, preparation, Action, Maintenance, and relapse. Group first was educated about the different stages within the cycle and then processed barriers to change along with triggers associated with relapse.  Pt actively participated in the group discussion but did not share personally on where she is in the stages of change.    Reyes IvanChelsea Horton, LCSW 10/26/2013  2:20 PM

## 2013-10-26 NOTE — [facility] Group Notes (Signed)
[facility]   10/26/2013 8:45 AM  Participation Quality:  Alert, Appropriate and Oriented  Mood/Affect:  Calm  Depression Rating:  0  Anxiety Rating:  2  Thoughts of Suicide:  Pt denies SI/HI  Will you contract for safety?   Yes  Current AVH:  Pt denies  Plan for Discharge/Comments:  Pt attended discharge planning group and actively participated in group.  CSW provided pt with today's workbook.  Pt reports feeling well today.  Pt will return home in [place] and [facility] and therapy.  No further needs voiced by pt at this time.    Transportation Means: Pt reports access to transportation - fiance will pick pt up  Supports: No supports mentioned at this time  Shelly IvanChelsea Horton, LCSW 10/26/2013 9:45 AM

## 2013-10-26 NOTE — Tx Team (Signed)
Interdisciplinary Treatment Plan Update (Adult)  Date: 10/26/2013  Time Reviewed:  9:45 AM  Progress in Treatment: Attending groups: Yes Participating in groups:  Yes Taking medication as prescribed:  Yes Tolerating medication:  Yes Family/Significant othe contact made: No, N/A Patient understands diagnosis:  Yes Discussing patient identified problems/goals with staff:  Yes Medical problems stabilized or resolved:  Yes Denies suicidal/homicidal ideation: Yes Issues/concerns per patient self-inventory:  Yes Other:  New problem(s) identified: N/A  Discharge Plan or Barriers: Pt [facility] and therapy.    Reason for Continuation of Hospitalization: Anxiety Depression Detox Medication Stabilization  Comments: N/A  Estimated length of stay: 3-4 days  For review of initial/current patient goals, please see plan of care.  Attendees: Patient:     Family:     Physician:  Dr. Dub MikesLugo 10/26/2013 12:06 PM   Nursing:   Lamount Crankerhris Judge, RN 10/26/2013 12:06 PM   Clinical Social Worker:  Reyes Ivanhelsea Horton, LCSW 10/26/2013 12:06 PM   Other: Onnie BoerJennifer Clark, RN case manager 10/26/2013 12:06 PM   Other:  Shelda JakesPatty Duke, RN 10/26/2013 12:06 PM   Other:  Serena ColonelAggie Nwoko, NP 10/26/2013 12:06 PM   Other:     Other:    Other:    Other:    Other:    Other:    Other:     Scribe for Treatment Team:   Carmina MillerHorton, Buelah Rennie Nicole, 10/26/2013 , 12:06 PM

## 2013-10-26 NOTE — Progress Notes (Signed)
[facility]  10/26/2013 5:10 PM Pa D Frederic  MRN:  [id number] Subjective:  Coming off the alcohol. Does not feel she can go to a rehab program. Wants to pursue outpatient treatment. She was made aware of the increased in the liver enzymes. She states that she is ready to do the work. States that she has had multiple loses due to the same. Her father had dependency to alcohol and he died of complications. States that this is when she started drinking and exhibit the progression building up to today.  Diagnosis:   DSM5: Schizophrenia Disorders:  None Obsessive-Compulsive Disorders:  None Trauma-Stressor Disorders:  none Substance/Addictive Disorders:  Alcohol Related Disorder - Severe (303.90) Depressive Disorders:  Major Depressive Disorder - Moderate (296.22) Total Time spent with patient: 30 minutes  Axis I: Substance Induced Mood Disorder  ADL's:  Intact  Sleep: Fair  Appetite:  Fair  Suicidal Ideation:  Plan:  denies Intent:  denies Means:  denies Homicidal Ideation:  Plan:  denies Intent:  denies Means:  denies AEB (as evidenced by):  Psychiatric Specialty Exam: Physical Exam  Review of Systems  Constitutional: Positive for malaise/fatigue.  HENT: Negative.   Eyes: Negative.   Respiratory: Negative.   Cardiovascular: Negative.   Gastrointestinal: Negative.   Genitourinary: Negative.   Musculoskeletal: Negative.   Skin: Negative.   Neurological: Negative.   Endo/Heme/Allergies: Negative.   Psychiatric/Behavioral: Positive for substance abuse. The patient is nervous/anxious and has insomnia.     Blood pressure 130/93, pulse 93, temperature 98 F (36.7 C), temperature source Oral, resp. rate 16, height 5\' 5"  (1.651 m), weight 64.864 kg (143 lb), last menstrual period 09/03/2013, SpO2 98.00%.Body mass index is 23.8 kg/(m^2).  General Appearance: Fairly Groomed  Patent attorneyye Contact::  Fair  Speech:  Clear and Coherent  Volume:  Normal  Mood:  Anxious and  worried  Affect:  anxious  Thought Process:  Coherent and Goal Directed  Orientation:  Full (Time, Place, and Person)  Thought Content:  symtpoms,worries, concerns  Suicidal Thoughts:  No  Homicidal Thoughts:  No  Memory:  Immediate;   Fair Recent;   Fair Remote;   Fair  Judgement:  Fair  Insight:  Present  Psychomotor Activity:  Restlessness  Concentration:  Fair  Recall:  [employer]Fair  Fund of Knowledge:NA  Language: Fair  Akathisia:  No  Handed:    AIMS (if indicated):     Assets:  Desire for Improvement Housing Social Support Vocational/Educational  Sleep:  Number of Hours: 6.75   Musculoskeletal: Strength & Muscle Tone: within normal limits Gait & Station: normal Patient leans: N/A  Current Medications: Current Facility-Administered Medications  Medication Dose Route Frequency Provider Last Rate Last Dose  . acetaminophen (TYLENOL) tablet 650 mg  650 mg Oral Q6H PRN Rachael FeeIrving A Kataryna Mcquilkin, MD      . alum & mag hydroxide-simeth (MAALOX/MYLANTA) 200-200-20 MG/5ML suspension 30 mL  30 mL Oral Q4H PRN Rachael FeeIrving A Aloura Matsuoka, MD      . chlordiazePOXIDE (LIBRIUM) capsule 25 mg  25 mg Oral Q6H PRN Rachael FeeIrving A Mirjana Tarleton, MD   25 mg at 10/25/13 2141  . chlordiazePOXIDE (LIBRIUM) capsule 25 mg  25 mg Oral BH-qamhs Rachael FeeIrving A Jolynda Townley, MD   25 mg at 10/26/13 [id number]   Followed by  . [START ON 10/27/2013] chlordiazePOXIDE (LIBRIUM) capsule 25 mg  25 mg Oral Daily Rachael FeeIrving A Joash Tony, MD      . hydrOXYzine (ATARAX/VISTARIL) tablet 25 mg  25 mg Oral Q6H PRN Reymundo PollIrving A  Dub MikesLugo, MD   25 mg at 10/24/13 2143  . lisinopril (PRINIVIL,ZESTRIL) tablet 10 mg  10 mg Oral Daily Sanjuana KavaAgnes I Nwoko, NP   10 mg at 10/26/13 0825  . loperamide (IMODIUM) capsule 2-4 mg  2-4 mg Oral PRN Rachael FeeIrving A Talbert Trembath, MD      . magnesium hydroxide (MILK OF MAGNESIA) suspension 30 mL  30 mL Oral Daily PRN Rachael FeeIrving A Aeryn Medici, MD      . multivitamin with minerals tablet 1 tablet  1 tablet Oral Daily Rachael FeeIrving A Alexxia Stankiewicz, MD   1 tablet at 10/26/13 0825  . nicotine (NICODERM CQ - dosed in  mg/24 hours) patch 21 mg  21 mg Transdermal Daily Rachael FeeIrving A Sharlie Shreffler, MD   21 mg at 10/26/13 [id number]  . ondansetron (ZOFRAN-ODT) disintegrating tablet 4 mg  4 mg Oral Q6H PRN Rachael FeeIrving A Ebert Forrester, MD   4 mg at 10/24/13 1332  . thiamine (VITAMIN B-1) tablet 100 mg  100 mg Oral Daily Rachael FeeIrving A Keilin Gamboa, MD   100 mg at 10/26/13 0825  . traZODone (DESYREL) tablet 50 mg  50 mg Oral QHS PRN Rachael FeeIrving A Harmony Sandell, MD   50 mg at 10/25/13 2141    Lab Results: No results found for this or any previous visit (from the past 48 hour(s)).  Physical Findings: AIMS: Facial and Oral Movements Muscles of Facial Expression: None, normal Lips and Perioral Area: None, normal Jaw: None, normal Tongue: None, normal,Extremity Movements Upper (arms, wrists, hands, fingers): None, normal Lower (legs, knees, ankles, toes): None, normal, Trunk Movements Neck, shoulders, hips: None, normal, Overall Severity Severity of abnormal movements (highest score from questions above): None, normal Incapacitation due to abnormal movements: None, normal Patient's awareness of abnormal movements (rate only patient's report): No Awareness, Dental Status Current problems with teeth and/or dentures?: No Does patient usually wear dentures?: No  CIWA:  CIWA-Ar Total: 4 COWS:     Treatment Plan Summary: Daily contact with patient to assess and evaluate symptoms and progress in treatment Medication management  Plan: Supportive approach/coping skills/relapse prevention           Continue detox/monitor the BP           Asked about medications for "my bipolar" told that she needs to wait to be alcohol free so her outpatient provider can assess her mood and need for medications           States the Trazodone gives her nightmares asked to be on Ambien while she is here Medical Decision Making Problem Points:  Review of psycho-social stressors (1) Data Points:  Review of medication regiment & side effects (2) Review of new medications or change in dosage  (2)  I certify that inpatient services furnished can reasonably be expected to improve the patient's condition.   Rachael Feerving A Frenchie Pribyl 10/26/2013, 5:10 PM

## 2013-10-27 MED ORDER — NICOTINE 21 MG/24HR TD PT24
MEDICATED_PATCH | TRANSDERMAL | Status: AC
  Administered 2013-10-27: 08:00:00
  Filled 2013-10-27: qty 1

## 2013-10-27 MED ORDER — CHLORDIAZEPOXIDE HCL 25 MG PO CAPS
25.0000 mg | ORAL_CAPSULE | Freq: Every day | ORAL | Status: DC
  Administered 2013-10-28 – 2013-10-29 (×2): 25 mg via ORAL
  Filled 2013-10-27 (×2): qty 1

## 2013-10-27 MED ORDER — CHLORDIAZEPOXIDE HCL 25 MG PO CAPS
25.0000 mg | ORAL_CAPSULE | Freq: Once | ORAL | Status: AC
  Administered 2013-10-27: 25 mg via ORAL
  Filled 2013-10-27: qty 1

## 2013-10-27 MED ORDER — CHLORDIAZEPOXIDE HCL 25 MG PO CAPS
ORAL_CAPSULE | ORAL | Status: AC
  Administered 2013-10-27: 25 mg
  Filled 2013-10-27: qty 1

## 2013-10-27 NOTE — [facility] Group Notes (Signed)
[facility] LCSW Group Therapy Note  10/27/2013/10 AM  Type of Therapy and Topic:  Group Therapy: Avoiding Self-Sabotaging and Enabling Behaviors  Participation Level:  Active   Mood: Appropriate  Description of Group:     Learn how to identify obstacles, self-sabotaging and enabling behaviors, what are they, why do we do them and what needs do these behaviors meet? Discuss unhealthy relationships and how to have positive healthy boundaries with those that sabotage and enable. Explore aspects of self-sabotage and enabling in yourself and how to limit these self-destructive behaviors in everyday life.  Therapeutic Goals: 1. Patient will identify one obstacle that relates to self-sabotage and enabling behaviors 2. Patient will identify one personal self-sabotaging or enabling behavior they did prior to admission 3. Patient able to establish a plan to change the above identified behavior they did prior to admission:  4. Patient will demonstrate ability to communicate their needs through discussion and/or role plays.   Summary of Patient Progress: The main focus of today's process group was to explain to the adolescent what "self-sabotage" means and use Motivational Interviewing to discuss what benefits, negative or positive, were involved in a self-identified self-sabotaging behavior. We then talked about stages of change flow chart and each patient identified where they were along with their self sabotaging behavior if any. Patient shared during warm up that she is looking forward to new job this year and is willing to work anywhere she feels safe. Patient was actively listening and engaged during session. She shared that she feels she is on pre contemplation action see saw and realizes isolation and non compliance with medication often get in her own way.    Therapeutic Modalities:   Cognitive Behavioral Therapy Person-Centered Therapy Motivational Interviewing   Shelly Bernatherine C Keiden Deskin, LCSW

## 2013-10-27 NOTE — Progress Notes (Addendum)
[facility]  10/27/2013 1:13 PM Shelly Tucker  MRN:  [id number] Subjective:    Hospitalized for alcohol abuse.  Tells me she feels better here in the hospital and safe. Wants to pursue outpatient treatment. I"I really want to do it this time, I know I'm killing myself"  Doctor discussed elevated liver enzymes, this concerns her.  She has support from her fiance who does not drink.  She hopes to find work  Likely discharge on Monday.  Plan is to set up OP appointment working with SW on this.  Is grateful for this hospital stay and opportunity.   Diagnosis:   DSM5: Schizophrenia Disorders:  None Obsessive-Compulsive Disorders:  None Trauma-Stressor Disorders:  none Substance/Addictive Disorders:  Alcohol Related Disorder - Severe (303.90) Depressive Disorders:  Major Depressive Disorder - Moderate (296.22) Total Time spent with patient: 25 minutes  Axis I: Substance Induced Mood Disorder  ADL's:  Intact  Sleep: Fair  Appetite:  Fair  Suicidal Ideation:  Plan:  denies Intent:  denies Means:  denies Homicidal Ideation:  Plan:  denies Intent:  denies Means:  denies AEB (as evidenced by):  Psychiatric Specialty Exam: Physical Exam  Constitutional: She is oriented to person, place, and time. She appears well-developed.  HENT:  Head: Normocephalic.  Neck: Normal range of motion.  Musculoskeletal: Normal range of motion.  Neurological: She is alert and oriented to person, place, and time.  Skin: Skin is warm and dry.    Review of Systems  Constitutional: Positive for malaise/fatigue.  HENT: Negative.   Eyes: Negative.   Respiratory: Negative.   Cardiovascular: Negative.   Gastrointestinal: Negative.   Genitourinary: Negative.   Musculoskeletal: Negative.   Skin: Negative.   Neurological: Negative.   Endo/Heme/Allergies: Negative.   Psychiatric/Behavioral: Positive for substance abuse. The patient is nervous/anxious and has insomnia.     Blood pressure  100/70, pulse 99, temperature 97.3 F (36.3 C), temperature source Oral, resp. rate 20, height 5\' 5"  (1.651 m), weight 64.864 kg (143 lb), last menstrual period 09/03/2013, SpO2 100.00%.Body mass index is 23.8 kg/(m^2).  General Appearance: Fairly Groomed  Patent attorneyye Contact::  Fair  Speech:  Clear and Coherent  Volume:  Normal  Mood:  Anxious and worried  Affect:  anxious  Thought Process:  Coherent and Goal Directed  Orientation:  Full (Time, Place, and Person)  Thought Content:  symtpoms,worries, concerns  Suicidal Thoughts:  No  Homicidal Thoughts:  No  Memory:  Immediate;   Fair Recent;   Fair Remote;   Fair  Judgement:  Fair  Insight:  Present  Psychomotor Activity:  Restlessness  Concentration:  Fair  Recall:  [employer]Fair  Fund of Knowledge:NA  Language: Fair  Akathisia:  No  Handed:    AIMS (if indicated):     Assets:  Desire for Improvement Housing Social Support Vocational/Educational  Sleep:  Number of Hours: 4.75   Musculoskeletal: Strength & Muscle Tone: within normal limits Gait & Station: normal Patient leans: N/A  Current Medications: Current Facility-Administered Medications  Medication Dose Route Frequency Provider Last Rate Last Dose  . acetaminophen (TYLENOL) tablet 650 mg  650 mg Oral Q6H PRN Rachael FeeIrving A Lugo, MD   650 mg at 10/26/13 2200  . alum & mag hydroxide-simeth (MAALOX/MYLANTA) 200-200-20 MG/5ML suspension 30 mL  30 mL Oral Q4H PRN Rachael FeeIrving A Lugo, MD      . lisinopril (PRINIVIL,ZESTRIL) tablet 10 mg  10 mg Oral Daily Sanjuana KavaAgnes I Nwoko, NP   10 mg at 10/27/13 0816  .  magnesium hydroxide (MILK OF MAGNESIA) suspension 30 mL  30 mL Oral Daily PRN Rachael FeeIrving A Lugo, MD      . multivitamin with minerals tablet 1 tablet  1 tablet Oral Daily Rachael FeeIrving A Lugo, MD   1 tablet at 10/27/13 0816  . nicotine (NICODERM CQ - dosed in mg/24 hours) 21 mg/24hr patch           . nicotine (NICODERM CQ - dosed in mg/24 hours) patch 21 mg  21 mg Transdermal Daily Rachael FeeIrving A Lugo, MD   21 mg at  10/26/13 [id number]  . thiamine (VITAMIN B-1) tablet 100 mg  100 mg Oral Daily Rachael FeeIrving A Lugo, MD   100 mg at 10/27/13 [id number]  . zolpidem (AMBIEN) tablet 10 mg  10 mg Oral QHS PRN Rachael FeeIrving A Lugo, MD   10 mg at 10/26/13 2255    Lab Results: No results found for this or any previous visit (from the past 48 hour(s)).  Physical Findings: AIMS: Facial and Oral Movements Muscles of Facial Expression: None, normal Lips and Perioral Area: None, normal Jaw: None, normal Tongue: None, normal,Extremity Movements Upper (arms, wrists, hands, fingers): None, normal Lower (legs, knees, ankles, toes): None, normal, Trunk Movements Neck, shoulders, hips: None, normal, Overall Severity Severity of abnormal movements (highest score from questions above): None, normal Incapacitation due to abnormal movements: None, normal Patient's awareness of abnormal movements (rate only patient's report): No Awareness, Dental Status Current problems with teeth and/or dentures?: No Does patient usually wear dentures?: No  CIWA:  CIWA-Ar Total: 2 COWS:     Treatment Plan Summary: Daily contact with patient to assess and evaluate symptoms and progress in treatment Medication management  Plan: Supportive approach/coping skills/relapse prevention                            Medical Decision Making Problem Points:  Review of psycho-social stressors (1) Data Points:  Review of medication regiment & side effects (2) Review of new medications or change in dosage (2)  I certify that inpatient services furnished can reasonably be expected to improve the patient's condition.   Canary BrimMary W Larach   PMHNP  10/27/2013, 1:13 PM I agreed with findings and treatment plan of this patient

## 2013-10-27 NOTE — Progress Notes (Signed)
Patient attended AA meeting. Tonight we had a speaker meeting. They actively listened. Shelly Tucker [name] 11:45 PM

## 2013-10-27 NOTE — Progress Notes (Signed)
Shelly Tucker is seen [facility] on the [facility] hall today...she tolerates this well. She is pleasant,cooeprative and takes her scheduled meds as ordered first thing  This morning.   A She completes her morning assessment and on it she writes she denies SI within  The past 24 hrs, she rates her depression and hopelessness "1/1", respectively and she writes " thank you for being here for me".   R Safety is in place and poc cont.

## 2013-10-27 NOTE — Progress Notes (Signed)
D. Pt has been up and has been active in the milieu, attending and participating in milieu activities. Pt reports feelings of anxiety and does appear anxious as she is restless and fidgety in milieu. Pt has spoke about the desire to get clean and to remain clean and sober. A. Support and encouragement provided, medication education given. R. Pt verbalized understanding, safety maintained.

## 2013-10-28 DIAGNOSIS — F1010 Alcohol abuse, uncomplicated: Secondary | ICD-10-CM

## 2013-10-28 DIAGNOSIS — F1994 Other psychoactive substance use, unspecified with psychoactive substance-induced mood disorder: Secondary | ICD-10-CM

## 2013-10-28 MED ORDER — ZOLPIDEM TARTRATE 10 MG PO TABS: 10.0000 mg | ORAL_TABLET | Freq: Every evening | ORAL | Status: DC | PRN

## 2013-10-28 MED ORDER — HYDROXYZINE HCL 50 MG PO TABS
25.0000 mg | ORAL_TABLET | Freq: Four times a day (QID) | ORAL | Status: DC | PRN
  Administered 2013-10-28: 25 mg via ORAL
  Filled 2013-10-28: qty 1
  Filled 2013-10-28: qty 20

## 2013-10-28 MED ORDER — HYDROXYZINE HCL 25 MG PO TABS: 25.0000 mg | ORAL_TABLET | Freq: Four times a day (QID) | ORAL | Status: DC | PRN

## 2013-10-28 MED ORDER — LISINOPRIL 10 MG PO TABS: 10.0000 mg | ORAL_TABLET | Freq: Every day | ORAL | Status: DC

## 2013-10-28 MED ORDER — NICOTINE 21 MG/24HR TD PT24: 21.0000 mg | MEDICATED_PATCH | Freq: Every day | TRANSDERMAL | Status: DC

## 2013-10-28 NOTE — Progress Notes (Signed)
[facility]  10/28/2013 12:05 PM Shelly Tucker  MRN:  [id number] Subjective:    Hospitalized for alcohol abuse.  Tells me she feels better here in the hospital and safe. Wants to pursue outpatient treatment. I"I really want to do it this time, I know I'm killing myself"  Doctor discussed elevated liver enzymes, this concerns her.  She has support from her fiance who does not drink.  She hopes to find work  Likely discharge on Monday.   At this time she is unsure of what the exact dc plan is as far [facility] and [facility] counseling available.  She plans to go to AA.  We called and got the list and number for local meetings.   She s grateful for this hospital stay and opportunity and feels optimistic  Rates Depression 0/10 Anxiety 1/10  Denies SIHI  Denies AVH  Diagnosis:   DSM5: Schizophrenia Disorders:  None Obsessive-Compulsive Disorders:  None Trauma-Stressor Disorders:  none Substance/Addictive Disorders:  Alcohol Related Disorder - Severe (303.90) Depressive Disorders:  Major Depressive Disorder - Moderate (296.22) Total Time spent with patient: 25 minutes  Axis I: Substance Induced Mood Disorder  ADL's:  Intact  Sleep: Good  Appetite:  Good Suicidal Ideation:  Plan:  denies Intent:  denies Means:  denies Homicidal Ideation:  Plan:  denies Intent:  denies Means:  denies AEB (as evidenced by):  Psychiatric Specialty Exam: Physical Exam  Constitutional: She is oriented to person, place, and time. She appears well-developed.  HENT:  Head: Normocephalic.  Neck: Normal range of motion.  Musculoskeletal: Normal range of motion.  Neurological: She is alert and oriented to person, place, and time.  Skin: Skin is warm and dry.    Review of Systems  Constitutional: Positive for malaise/fatigue.  HENT: Negative.   Eyes: Negative.   Respiratory: Negative.   Cardiovascular: Negative.   Gastrointestinal: Negative.   Genitourinary:  Negative.   Musculoskeletal: Negative.   Skin: Negative.   Neurological: Negative.   Endo/Heme/Allergies: Negative.   Psychiatric/Behavioral: Positive for substance abuse. The patient is nervous/anxious and has insomnia.     Blood pressure 142/106, pulse 83, temperature 97.3 F (36.3 C), temperature source Oral, resp. rate 16, height 5\' 5"  (1.651 m), weight 64.864 kg (143 lb), last menstrual period 09/03/2013, SpO2 99.00%.Body mass index is 23.8 kg/(m^2).  General Appearance: Fairly Groomed  Patent attorneyye Contact::  Fair  Speech:  Clear and Coherent  Volume:  Normal  Mood:  Anxious and worried  Affect:  anxious  Thought Process:  Coherent and Goal Directed  Orientation:  Full (Time, Place, and Person)  Thought Content:  symtpoms,worries, concerns  Suicidal Thoughts:  No  Homicidal Thoughts:  No  Memory:  Immediate;   Fair Recent;   Fair Remote;   Fair  Judgement:  Fair  Insight:  Present  Psychomotor Activity:  negative  Concentration:  Fair  Recall:  [employer]Fair  Fund of Knowledge:NA  Language: Fair  Akathisia:  No  Handed:    AIMS (if indicated):     Assets:  Desire for Improvement Housing Social Support Vocational/Educational  Sleep:  Number of Hours: 5   Musculoskeletal: Strength & Muscle Tone: within normal limits Gait & Station: normal Patient leans: N/A  Current Medications: Current Facility-Administered Medications  Medication Dose Route Frequency Provider Last Rate Last Dose  . acetaminophen (TYLENOL) tablet 650 mg  650 mg Oral Q6H PRN Rachael FeeIrving A Lugo, MD   650 mg at  10/28/13 1033  . alum & mag hydroxide-simeth (MAALOX/MYLANTA) 200-200-20 MG/5ML suspension 30 mL  30 mL Oral Q4H PRN Rachael FeeIrving A Lugo, MD      . chlordiazePOXIDE (LIBRIUM) capsule 25 mg  25 mg Oral Daily Canary BrimMary W Larach, NP   25 mg at 10/28/13 1027  . lisinopril (PRINIVIL,ZESTRIL) tablet 10 mg  10 mg Oral Daily Sanjuana KavaAgnes I Nwoko, NP   10 mg at 10/28/13 1028  . magnesium hydroxide (MILK OF MAGNESIA) suspension 30 mL  30 mL  Oral Daily PRN Rachael FeeIrving A Lugo, MD      . multivitamin with minerals tablet 1 tablet  1 tablet Oral Daily Rachael FeeIrving A Lugo, MD   1 tablet at 10/28/13 1028  . nicotine (NICODERM CQ - dosed in mg/24 hours) patch 21 mg  21 mg Transdermal Daily Rachael FeeIrving A Lugo, MD   21 mg at 10/28/13 1029  . thiamine (VITAMIN B-1) tablet 100 mg  100 mg Oral Daily Rachael FeeIrving A Lugo, MD   100 mg at 10/28/13 1029  . zolpidem (AMBIEN) tablet 10 mg  10 mg Oral QHS PRN Rachael FeeIrving A Lugo, MD   10 mg at 10/27/13 2250    Lab Results: No results found for this or any previous visit (from the past 48 hour(s)).  Physical Findings: AIMS: Facial and Oral Movements Muscles of Facial Expression: None, normal Lips and Perioral Area: None, normal Jaw: None, normal Tongue: None, normal,Extremity Movements Upper (arms, wrists, hands, fingers): None, normal Lower (legs, knees, ankles, toes): None, normal, Trunk Movements Neck, shoulders, hips: None, normal, Overall Severity Severity of abnormal movements (highest score from questions above): None, normal Incapacitation due to abnormal movements: None, normal Patient's awareness of abnormal movements (rate only patient's report): No Awareness, Dental Status Current problems with teeth and/or dentures?: No Does patient usually wear dentures?: No  CIWA:  CIWA-Ar Total: 1 COWS:     Treatment Plan Summary: Daily contact with patient to assess and evaluate symptoms and progress in treatment Medication management  Plan: Supportive approach/coping skills/relapse prevention            Discussed AA and got list and numbers for local meetings            Discussed personal choice and responsibilities                              Medical Decision Making Problem Points:  Review of psycho-social stressors (1) Data Points:  Review of medication regiment & side effects (2) Review of new medications or change in dosage (2)  I certify that inpatient services furnished can reasonably be expected to  improve the patient's condition.   Canary BrimMary W Larach   PMHNP  10/28/2013, 12:05 PM I have personally seen the patient and agreed with the findings and involved in the treatment plan. Suicide assessment done. Planning for early Monday morning discharge. Thresa RossNadeem Domenick Quebedeaux, MD

## 2013-10-28 NOTE — Progress Notes (Signed)
Shelly Tucker has done an excellent job staying engaged in her recovery and focused on " healthy" today.   A She takes he meds as ordered and she completes her morning self inventory and on it she writes she denies SI within the past 24 hrs, she rates her depression and hopelessness "1 / 2 "  And says her DC plan includes " being around a diff environment / getting a job / getting my s--- together".    R POC includes DC tomorrow AM. DC order for 10/29/13 in computer, DC AVS printed and attached to pt's chart as well as prescriptions and sample meds.

## 2013-10-28 NOTE — [facility] Group Notes (Signed)
[facility] LCSW Group Therapy Note   10/28/2013 / 10:00 AM  Type of Therapy and Topic: Group Therapy: Feelings Around Returning Home & Establishing a Supportive Framework and Activity to Identify signs of Improvement or Decompensation   Participation Level:  None other than being in the room for brief periods of time   Carney Bernatherine C Harrill, LCSW

## 2013-10-28 NOTE — Progress Notes (Signed)
Patient attended AA group meeting.  

## 2013-10-28 NOTE — Discharge Summary (Signed)
Physician Discharge Summary Note  Patient:  Shelly Tucker is an 33 y.o., female MRN:  [id number] DOB:  Oct 27, 1980 Patient phone:  [phone] (home)  Patient address:   [address]  [place] [address],  Total Time spent with patient: 25 min Date of Admission:  10/24/2013 Date of Discharge: 10-29-13  Reason for Admission:  Alcohol dependence and abuse and detox  Discharge Diagnoses: Active Problems:   Alcohol dependence   Psychiatric Specialty Exam: Physical Exam  Constitutional: She is oriented to person, place, and time. She appears well-developed and well-nourished.  HENT:  Head: Normocephalic.  Neck: Normal range of motion.  Musculoskeletal: Normal range of motion.  Neurological: She is alert and oriented to person, place, and time.  Skin: Skin is warm and dry.    ROS  Blood pressure 142/106, pulse 83, temperature 97.3 F (36.3 C), temperature source Oral, resp. rate 16, height 5\' 5"  (1.651 m), weight 64.864 kg (143 lb), last menstrual period 09/03/2013, SpO2 99.00%.Body mass index is 23.8 kg/(m^2).  General Appearance: Casual  Eye Contact::  Good  Speech:  Clear and Coherent  Volume:  Normal  Mood:  Euthymic  Affect:  Negative  Thought Process:  Goal Directed  Orientation:  Full (Time, Place, and Person)  Thought Content:  Negative and NA  Suicidal Thoughts:  No  Homicidal Thoughts:  No  Memory:  Immediate;   Good Recent;   Good Remote;   Good  Judgement:  Fair  Insight:  Fair  Psychomotor Activity:  Normal  Concentration:  Fair  Recall:  [employer]  Fund of Knowledge:Fair  Language: Fair  Akathisia:  Negative  Handed:  Right  AIMS (if indicated):     Assets:  Communication Skills Desire for Improvement Physical Health  Sleep:  Number of Hours: 5      Musculoskeletal: Strength & Muscle Tone: within normal limits Gait & Station: normal Patient leans: N/A  DSM5:  Substance/Addictive Disorders:  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 History of ETOH abuse   . Laceration of digital nerve of finger     right small finger  . Anxiety    AXIS IV:  economic problems, educational problems, other psychosocial or environmental problems, problems related to social environment, problems with access to health care services and problems with primary support group AXIS V:  51-60 moderate symptoms  Level of Care:  OP  Hospital Course:  [name] admitted for help with her alcohol abuse. She drinks almotst every day and has been doing so for the last 10 years. Can drink a fifth or a 12 pack plus of beer. She admits to occasional use of cocaine and marijuana. States she started drinking after her father died. She used alcohol to deal with her feelings and she admits that now she is dependent on alcohol for sleep and being able to funcion. She experiences withdrawal when she does not drink. She states she is realizing it has caused her a lot of pain and distress  Librium protocol used and patient did well during detox  During hospital stay she participated in groups and reports that she she "feels much better and hopeful for the future"   She plans to  Attend AA meetings once dc and is hopeful to find work.  She is requesting medication for anxiety on discharge for short period  of time until she can get to her PCP   Consults:  None  Significant Diagnostic Studies:  labs: reviewed from ED  Discharge Vitals:   Blood pressure 142/106, pulse 83, temperature 97.3 F (36.3 C), temperature source Oral, resp. rate 16, height 5\' 5"  (1.651 m), weight 64.864 kg (143 lb), last menstrual period 09/03/2013, SpO2 99.00%. Body mass index is 23.8 kg/(m^2). Lab Results:   No results found for this or any previous visit (from the past 72 hour(s)).  Physical Findings: AIMS: Facial and Oral Movements Muscles of Facial  Expression: None, normal Lips and Perioral Area: None, normal Jaw: None, normal Tongue: None, normal,Extremity Movements Upper (arms, wrists, hands, fingers): None, normal Lower (legs, knees, ankles, toes): None, normal, Trunk Movements Neck, shoulders, hips: None, normal, Overall Severity Severity of abnormal movements (highest score from questions above): None, normal Incapacitation due to abnormal movements: None, normal Patient's awareness of abnormal movements (rate only patient's report): No Awareness, Dental Status Current problems with teeth and/or dentures?: No Does patient usually wear dentures?: No  CIWA:  CIWA-Ar Total: 1 COWS:     Psychiatric Specialty Exam: See Psychiatric Specialty Exam and Suicide Risk Assessment completed by Attending Physician prior to discharge.  Discharge destination:  Home  Is patient on multiple antipsychotic therapies at discharge:  No   Has Patient had three or more failed trials of antipsychotic monotherapy by history:  No  Recommended Plan for Multiple Antipsychotic Therapies: NA     Medication List    Notice   You have not been prescribed any medications.         Follow-up Information   Follow up with [facility] On 10/30/2013. (Walk in on this date for hospital discharge appointment. Walk in clinic is Monday - Friday 8 am - 3 pm. They will than schedule you for medication management and therapy appts. )    Contact information:   [address] [place], [place] [address] Phone: [phone] Fax: [phone]      Follow-up recommendations:  Activity:  as tolerated Diet:  heart healthy  Comments:  Discussed with patein personal choices and responsibilities in her own care plan.  Maintenance is a concern  Total Discharge Time:  Greater than 30 minutes.  Signed: Canary BrimMary W Larach   PMHNP 10/28/2013, 8:47 AM  I have examined the patient and agree with the discharge plan and findings. Also have done suicide asessment.

## 2013-10-28 NOTE — Progress Notes (Signed)
[facility] Group Notes:  (Nursing/MHT/Case Management/Adjunct)  Date:  10/28/2013  Time:  6:26 PM  Type of Therapy:  Therapeutic Activity  Participation Level:  Active  Participation Quality:  Appropriate, Attentive and Supportive  Affect:  Appropriate  Cognitive:  Appropriate  Insight:  Appropriate  Engagement in Group:  Engaged and Supportive  Modes of Intervention:  Activity  Summary of Progress/Problems: Pts played an activity using the Therapeutic Ball. Pt was engaged.  Carianna Lague C Keonte Daubenspeck 10/28/2013, 6:26 PM 

## 2013-10-28 NOTE — [facility] Suicide Risk Assessment (Signed)
Suicide Risk Assessment  Discharge Assessment     Demographic Factors:  Female  Total Time spent with patient: 30 minutes  Psychiatric Specialty Exam:     Blood pressure 142/106, pulse 83, temperature 97.3 F (36.3 C), temperature source Oral, resp. rate 16, height 5\' 5"  (1.651 m), weight 64.864 kg (143 lb), last menstrual period 09/03/2013, SpO2 99.00%.Body mass index is 23.8 kg/(m^2).  General Appearance: Casual  Eye Contact::  Fair  Speech:  Slow  Volume:  Decreased  Mood:  Euthymic  Affect:  Constricted  Thought Process:  Coherent  Orientation:  Full (Time, Place, and Person)  Thought Content:  Rumination  Suicidal Thoughts:  No  Homicidal Thoughts:  No  Memory:  Recent;   Fair  Judgement:  Intact  Insight:  Fair  Psychomotor Activity:  Decreased  Concentration:  Fair  Recall:  [employer]Fair  Fund of Knowledge:Fair  Language: Fair  Akathisia:  Negative  Handed:  Right  AIMS (if indicated):     Assets:  Desire for Improvement Resilience  Sleep:  Number of Hours: 5    Musculoskeletal: Strength & Muscle Tone: within normal limits Gait & Station: normal Patient leans: N/A   Mental Status Per Nursing Assessment::   On Admission:     Current Mental Status by Physician: see Current above MSE  Loss Factors: Decrease in vocational status and Financial problems/change in socioeconomic status  Historical Factors: Impulsivity  Risk Reduction Factors:   Positive coping skills or problem solving skills  Continued Clinical Symptoms:  Dysthymia  Cognitive Features That Contribute To Risk:  Closed-mindedness    Suicide Risk:  Minimal: No identifiable suicidal ideation.  Patients presenting with no risk factors but with morbid ruminations; may be classified as minimal risk based on the severity of the depressive symptoms  Discharge Diagnoses:   AXIS I:  Substance Induced Mood Disorder and Alcohol use disorder AXIS II:  Deferred AXIS III:   Past Medical History   Diagnosis Date  . History of ETOH abuse   . Laceration of digital nerve of finger     right small finger  . Anxiety    AXIS IV:  other psychosocial or environmental problems AXIS V:  51-60 moderate symptoms  Plan Of Care/Follow-up recommendations:  Activity:  as tolerated Diet:  regular Follow up with appointments and recommendations. See Discharge summary for details.  Is patient on multiple antipsychotic therapies at discharge:  No   Has Patient had three or more failed trials of antipsychotic monotherapy by history:  No  Recommended Plan for Multiple Antipsychotic Therapies: NA    Thresa RossNadeem Ellah Otte MD 10/28/2013, 3:36 PM

## 2013-10-28 NOTE — Progress Notes (Signed)
Patient ID: Shelly Tucker, female   DOB: Oct 21, 1980, 33 y.o.   MRN: [id number] D)  Has been actively participating in the milieu this evening, interacting appropriately with staff and select peers, and attended the AA group.  Stated is feeling better, other than some anxiety, feels detox is going well, wants to continue to do well.   A)  Will continue to monitor for safety, continue POC R)  Safety maintained.

## 2013-10-29 MED ORDER — HYDROXYZINE HCL 25 MG PO TABS: ORAL_TABLET | ORAL | Status: DC

## 2013-10-29 MED ORDER — LISINOPRIL 10 MG PO TABS: 10.0000 mg | ORAL_TABLET | Freq: Every day | ORAL | Status: DC

## 2013-10-29 MED ORDER — HYDROXYZINE HCL 50 MG PO TABS
25.0000 mg | ORAL_TABLET | Freq: Three times a day (TID) | ORAL | Status: DC | PRN
  Filled 2013-10-29: qty 20

## 2013-10-29 MED ORDER — ZOLPIDEM TARTRATE 10 MG PO TABS: 10.0000 mg | ORAL_TABLET | Freq: Every evening | ORAL | Status: DC | PRN

## 2013-10-29 MED ORDER — NICOTINE 21 MG/24HR TD PT24: 21.0000 mg | MEDICATED_PATCH | Freq: Every day | TRANSDERMAL | Status: DC

## 2013-10-29 NOTE — Progress Notes (Signed)
Patient ID: Shelly Tucker, female   DOB: 09-Mar-1981, 33 y.o.   MRN: [id number] She has been discharged home and was picked up by a friend. She voiced understanding of discharge instructions and of the follow up plan. She denies SI thoughts and all her belongings were taken home with her.

## 2013-10-29 NOTE — [facility] Group Notes (Signed)
[facility] LCSW After[facility] Discharge Planning Group Note   10/29/2013 11:07 AM  Participation Quality:  Appropriate   Mood/Affect:  Appropriate  Depression Rating:  1  Anxiety Rating:  2  Thoughts of Suicide:  No Will you contract for safety?   NA  Current AVH:  No  Plan for Discharge/Comments:  Pt reports that she is leaving today and plans to go to [facility] for med management/therapy and attend AA classes. Per her request, pt [facility] and given a list of [place] AA schedule.   Transportation Means: friend   Supports: friends and some family supports identified.   Alon Mazor [employer]LCSWA

## 2013-10-29 NOTE — Progress Notes (Signed)
Adult Psychoeducational Group Note  Date:  10/29/2013 Time:  1:06 PM  Group Topic/Focus:  Self Care:   The focus of this group is to help patients understand the importance of self-care in order to improve or restore emotional, physical, spiritual, interpersonal, and financial health.  Participation Level:  Active  Participation Quality:  Appropriate, Attentive, Sharing and Supportive  Affect:  Appropriate  Cognitive:  Appropriate  Insight: Appropriate and Good  Engagement in Group:  Engaged and Supportive  Modes of Intervention:  Discussion, Socialization and Support  Additional Comments:  Pt stated she needed to improve her self care by bettering herself and her self-esteem. Pt stated she could work towards this goal by thinking positive and putting herself first.  Caswell CorwinDana C Benyamin Jeff 10/29/2013, 1:06 PM

## 2013-10-29 NOTE — Discharge Summary (Signed)
Physician Discharge Summary Note  Patient:  Shelly Tucker is an 33 y.o., female MRN:  [id number] DOB:  1980-11-05 Patient phone:  [phone] (home)  Patient address:   [address]  [place] [address],  Total Time spent with patient: Greater than 30 minutes  Date of Admission:  10/24/2013 Date of Discharge: 10/29/13  Reason for Admission:  Alcohol dependence  Discharge Diagnoses: Active Problems:   Alcohol dependence   Psychiatric Specialty Exam: Physical Exam  Psychiatric: Her speech is normal and behavior is normal. Judgment and thought content normal. Her mood appears not anxious. Her affect is not angry, not blunt, not labile and not inappropriate. Cognition and memory are normal. She does not exhibit a depressed mood.    Review of Systems  Constitutional: Negative.   HENT: Negative.   Eyes: Negative.   Respiratory: Negative.   Cardiovascular: Negative.   Gastrointestinal: Negative.   Genitourinary: Negative.   Musculoskeletal: Negative.   Skin: Negative.   Neurological: Negative.   Endo/Heme/Allergies: Negative.   Psychiatric/Behavioral: Positive for substance abuse (Alcoholism, chronic). Negative for depression, suicidal ideas, hallucinations and memory loss. The patient has insomnia (Stable). The patient is not nervous/anxious.     Blood pressure 106/68, pulse 123, temperature 98.3 F (36.8 C), temperature source Oral, resp. rate 16, height 5\' 5"  (1.651 m), weight 64.864 kg (143 lb), last menstrual period 09/03/2013, SpO2 99.00%.Body mass index is 23.8 kg/(m^2).   General Appearance: Fairly Groomed   Patent attorney:: Fair   Speech: Clear and Coherent   Volume: Decreased   Mood: Euthymic   Affect: Appropriate   Thought Process: Coherent and Goal Directed   Orientation: Full (Time, Place, and Person)   Thought Content: relapse prevention plan   Suicidal Thoughts: No   Homicidal Thoughts: No   Memory: Immediate; Fair  Recent; Fair  Remote; Fair    Judgement: Fair   Insight: Present   Psychomotor Activity: Normal   Concentration: Fair   Recall: [employer] of Knowledge:NA   Language: Fair   Akathisia: No   Handed:   AIMS (if indicated):   Assets: Desire for Improvement  Social Support   Sleep: Number of Hours: 4.75    Past Psychiatric History: Diagnosis: Alcohol dependence  Hospitalizations: [facility] adult unit  Outpatient Care: [facility]  Substance Abuse Care: [facility], AA/NA meetings  Self-Mutilation: NA  Suicidal Attempts: NA  Violent Behaviors: NA   Musculoskeletal: Strength & Muscle Tone: within normal limits Gait & Station: normal Patient leans: N/A  DSM5: Schizophrenia Disorders:  NA Obsessive-Compulsive Disorders:  NA Trauma-Stressor Disorders:  NA Substance/Addictive Disorders:  Alcohol Related Disorder - Severe (303.90) Depressive Disorders:  NA  Axis Diagnosis:  AXIS I:  Alcohol dependence AXIS II:  Deferred AXIS III:   Past Medical History  Diagnosis Date  . History of ETOH abuse   . Laceration of digital nerve of finger     right small finger  . Anxiety    AXIS IV:  other psychosocial or environmental problems and Alcoholism AXIS V:  62  Level of Care:  OP  Hospital Course:  [name] who comes requesting help with her alcohol abuse. She drinks almotst every day and has been doing so for the last 10 years. Can drink a fifth or a 12 pack plus of beer. She admits to occasional use of cocaine and marijuana. States she started drinking after her father died. She used alcohol to deal with her feelings and she admits that now  she is dependent on alcohol for sleep and being able to funcion. She experiences withdrawal when she does not drink. She states she is realizing it has caused her a lot. She is losing more and more because of it.  Shelly Tucker was admitted to the hospital with her toxicology reports showing blood alcohol levels of 266, and her UDS reports showing positive cocaine and THC.  She was intoxicated requiring detox treatment. Shelly Tucker received Librium detox protocols. She was also enrolled in the group counseling sessions and AA/NA meetings being offered and held on this unit. She learned coping skills. She was also medicated with Hydroxyzine 25 mg for anxiety, Ambien 10 mg Q bedtime for sleep, Nicotine patch 21 mg for nicotine dependence and Lisinopril 10 mg Q daily for high blood pressure. She tolerated her treatment regimen without any significant adverse effects and or reactions.  Shelly Tucker has completed detox treatments. Her mood is stable. She is currently being discharged to continue treatment at the [facility] [place]. She has been provided with all the pertinent information needed to make this appointment without problems. Upon discharge, Shelly Tucker apparently denies any SIHI, AVH, delusional thoughts, paranoia and or withdrawal symptoms. [facility] 14 days worth, supply samples of [facility]. [facility] left [facility] with all personal belongings in no distress. Transportation per family.  Consults:  psychiatry  Significant Diagnostic Studies:  labs: CBC with diff, CMP, UDS, toxicology tests, U/A  Discharge Vitals:   Blood pressure 106/68, pulse 123, temperature 98.3 F (36.8 C), temperature source Oral, resp. rate 16, height 5\' 5"  (1.651 m), weight 64.864 kg (143 lb), last menstrual period 09/03/2013, SpO2 99.00%. Body mass index is 23.8 kg/(m^2). Lab Results:   No results found for this or any previous visit (from the past 72 hour(s)).  Physical Findings: AIMS: Facial and Oral Movements Muscles of Facial Expression: None, normal Lips and Perioral Area: None, normal Jaw: None, normal Tongue: None, normal,Extremity Movements Upper (arms, wrists, hands, fingers): None, normal Lower (legs, knees, ankles, toes): None, normal, Trunk Movements Neck, shoulders, hips: None, normal, Overall Severity Severity of abnormal movements  (highest score from questions above): None, normal Incapacitation due to abnormal movements: None, normal Patient's awareness of abnormal movements (rate only patient's report): No Awareness, Dental Status Current problems with teeth and/or dentures?: No Does patient usually wear dentures?: No  CIWA:  CIWA-Ar Total: 3 COWS:     Psychiatric Specialty Exam: See Psychiatric Specialty Exam and Suicide Risk Assessment completed by Attending Physician prior to discharge.  Discharge destination:  Home  Is patient on multiple antipsychotic therapies at discharge:  No   Has Patient had three or more failed trials of antipsychotic monotherapy by history:  No  Recommended Plan for Multiple Antipsychotic Therapies: NA    Medication List       Indication   hydrOXYzine 25 MG tablet  Commonly known as:  ATARAX/VISTARIL  Take 25 mg (1 tablet) three times daily for anxiety   Indication:  Anxiety associated with Organic Disease, Tension     lisinopril 10 MG tablet  Commonly known as:  PRINIVIL,ZESTRIL  Take 1 tablet (10 mg total) by mouth daily. For hypertension   Indication:  High Blood Pressure     nicotine 21 mg/24hr patch  Commonly known as:  NICODERM CQ - dosed in mg/24 hours  Place 1 patch (21 mg total) onto the skin daily. For nicotine addiction   Indication:  Nicotine Addiction  zolpidem 10 MG tablet  Commonly known as:  AMBIEN  Take 1 tablet (10 mg total) by mouth at bedtime as needed for sleep.   Indication:  Trouble Sleeping       Follow-up Information   Follow up with [facility] On 10/30/2013. (Walk in on this date for hospital discharge appointment. Walk in clinic is Monday - Friday 8 am - 3 pm. They will than schedule you for medication management and therapy appts. )    Contact information:   [address] [place], [place] [address] Phone: [phone] Fax: [phone]     Follow-up recommendations:  Activity:  As tolerated Diet: As recommended by your primary care  doctor. Keep all scheduled follow-up appointments as recommended.   Comments: Take all your medications as prescribed by your mental healthcare provider. Report any adverse effects and or reactions from your medicines to your outpatient provider promptly. Patient is instructed and cautioned to not engage in alcohol and or illegal drug use while on prescription medicines. In the event of worsening symptoms, patient is instructed to call the crisis hotline, 911 and or go to the nearest ED for appropriate evaluation and treatment of symptoms. Follow-up with your primary care provider for your other medical issues, concerns and or health care needs.   Total Discharge Time:  Greater than 30 minutes.  Signed: Sanjuana Kavagnes I Nwoko, PMHNP-BC 10/29/2013, 3:09 PM Evaluated the patient and agree with assessment and plan Madie RenoIrving A. Aymar Whitfill,M.D.

## 2013-10-29 NOTE — Progress Notes (Signed)
Patient ID: Shelly Tucker, female   DOB: July 09, 1980, 33 y.o.   MRN: [id number] D)  Was in the dayroom this evening, went to aa group but left with her roommate before group was completely over, they were in their room eating snacks.  Stated plans to [facility] when discharged tomorrow.  .  Came to med window for hs meds and requested tylenol for a mild headache, also hs meds.  Affect is flat, sad but gives a smile, hoping to make the changes she needs to make in her life.   A)  Will continue to mnitor q 15 minutes for safety, continue POC R)  Safety maintained.

## 2013-10-29 NOTE — [facility] Suicide Risk Assessment (Signed)
Suicide Risk Assessment  Discharge Assessment     Demographic Factors:  Caucasian  Total Time spent with patient: 45 minutes  Psychiatric Specialty Exam:     Blood pressure 106/68, pulse 123, temperature 98.3 F (36.8 C), temperature source Oral, resp. rate 16, height 5\' 5"  (1.651 m), weight 64.864 kg (143 lb), last menstrual period 09/03/2013, SpO2 99.00%.Body mass index is 23.8 kg/(m^2).  General Appearance: Fairly Groomed  Patent attorneyye Contact::  Fair  Speech:  Clear and Coherent  Volume:  Decreased  Mood:  Euthymic  Affect:  Appropriate  Thought Process:  Coherent and Goal Directed  Orientation:  Full (Time, Place, and Person)  Thought Content:  relapse prevention plan  Suicidal Thoughts:  No  Homicidal Thoughts:  No  Memory:  Immediate;   Fair Recent;   Fair Remote;   Fair  Judgement:  Fair  Insight:  Present  Psychomotor Activity:  Normal  Concentration:  Fair  Recall:  [employer]  Fund of Knowledge:NA  Language: Fair  Akathisia:  No  Handed:    AIMS (if indicated):     Assets:  Desire for Improvement Social Support  Sleep:  Number of Hours: 4.75    Musculoskeletal: Strength & Muscle Tone: within normal limits Gait & Station: normal Patient leans: N/A   Mental Status Per Nursing Assessment::   On Admission:     Current Mental Status by Physician: in full contact with reality. There are no active S/S of withdrawal. States she is willing and motivated to pursue further outpatient treatment and work on long term abstinence   Loss Factors: NA  Historical Factors: NA  Risk Reduction Factors:   Sense of responsibility to family, Living with another person, especially a relative and Positive social support  Continued Clinical Symptoms:  Alcohol/Substance Abuse/Dependencies  Cognitive Features That Contribute To Risk:  Closed-mindedness Polarized thinking Thought constriction (tunnel vision)    Suicide Risk:  Minimal: No identifiable suicidal ideation.   Patients presenting with no risk factors but with morbid ruminations; may be classified as minimal risk based on the severity of the depressive symptoms  Discharge Diagnoses:   AXIS I:  Alcohol Dependence, Substance Induced Mood Disorder AXIS II:  No diagnosis AXIS III:   Past Medical History  Diagnosis Date  . History of ETOH abuse   . Laceration of digital nerve of finger     right small finger  . Anxiety    AXIS IV:  other psychosocial or environmental problems AXIS V:  61-70 mild symptoms  Plan Of Care/Follow-up recommendations:  Activity:  as tolerated Diet:  regular Follow up outpatient basis/AA Is patient on multiple antipsychotic therapies at discharge:  No   Has Patient had three or more failed trials of antipsychotic monotherapy by history:  No  Recommended Plan for Multiple Antipsychotic Therapies: NA    Rachael FeeIrving A Carey Johndrow 10/29/2013, 11:28 AM

## 2013-11-02 NOTE — Progress Notes (Signed)
Patient Discharge Instructions:  After Visit Summary (AVS):   Faxed to:  11/02/13 Discharge Summary Note:   Faxed to:  11/02/13 Psychiatric Admission Assessment Note:   Faxed to:  11/02/13 Suicide Risk Assessment - Discharge Assessment:   Faxed to:  11/02/13 Faxed/Sent to the Next Level Care provider:  11/02/13 [facility] to [facility] @ [phone]  Jerelene ReddenSheena E Riley, 11/02/2013, 2:08 PM

## 2017-03-12 ENCOUNTER — Emergency Department (HOSPITAL_COMMUNITY)
Admission: EM | Admit: 2017-03-12 | Discharge: 2017-03-12 | Disposition: A | Discharge status: Home / Self Care | Payer: Self-pay | Attending: Emergency Medicine | Admitting: Emergency Medicine

## 2017-03-12 ENCOUNTER — Emergency Department (HOSPITAL_COMMUNITY): Payer: Self-pay

## 2017-03-12 ENCOUNTER — Encounter (HOSPITAL_COMMUNITY): Payer: Self-pay

## 2017-03-12 DIAGNOSIS — F17210 Nicotine dependence, cigarettes, uncomplicated: Secondary | ICD-10-CM | POA: Insufficient documentation

## 2017-03-12 DIAGNOSIS — Z79899 Other long term (current) drug therapy: Secondary | ICD-10-CM | POA: Insufficient documentation

## 2017-03-12 DIAGNOSIS — R0789 Other chest pain: Secondary | ICD-10-CM | POA: Insufficient documentation

## 2017-03-12 MED ORDER — LIDOCAINE 4 % EX CREA: 1.0000 "application " | TOPICAL_CREAM | Freq: Three times a day (TID) | CUTANEOUS | 0 refills | Status: DC | PRN

## 2017-03-12 MED ORDER — KETOROLAC TROMETHAMINE 60 MG/2ML IM SOLN
30.0000 mg | Freq: Once | INTRAMUSCULAR | Status: AC
  Administered 2017-03-12: 30 mg via INTRAMUSCULAR
  Filled 2017-03-12: qty 2

## 2017-03-12 NOTE — Discharge Instructions (Signed)
Alternate 600 mg of ibuprofen and [phone] mg of Tylenol every 3 hours as needed for pain. Do not exceed 4000 mg of Tylenol daily. Apply ice or heat, whichever feels best to the affected area. Do some gentle stretching during hot baths or hot showers. Use the incentive spirometer 3 times daily at least to avoid the development of a pneumonia. Return to the ED if you develop any fevers, productive cough, or worsening symptoms. Follow up with a primary care physician if your symptoms persist.

## 2017-03-12 NOTE — ED Provider Notes (Signed)
[facility] DEPT Provider Note   CSN: [id number] Arrival date & time: 03/12/17  0825     History   Chief Complaint Chief Complaint  Patient presents with  . Chest Pain    HPI Shelly Tucker is a 36 y.o. female with history of anxiety and EtOH abuse who presents today with chief complaint acute onset, constant left sided chest pain for 2 days. She states that 2 days ago she turned and fell and hit the left side of her chest on the corner of the table. She denies headache injury or loss of consciousness. She states that since then she has had constant aching pain with sharp pain when she takes deep breaths. Pain does not radiate. She does endorse some nausea due to her pain. Pain worsens with deep breaths, coughing, sneezing, laughing, or certain movements. She has not tried anything for her symptoms. No alleviating factors noted. She has had a mild cough productive of clear sputum with this. She denies fevers, chills, vomiting, abdominal pain.  The history is provided by the patient.    Past Medical History:  Diagnosis Date  . Anxiety   . History of ETOH abuse   . Laceration of digital nerve of finger    right small finger    Patient Active Problem List   Diagnosis Date Noted  . Alcohol dependence (HCC) 10/24/2013    Past Surgical History:  Procedure Laterality Date  . Arm surgery       OB History    No data available       Home Medications    Prior to Admission medications   Medication Sig Start Date End Date Taking? Authorizing Provider  hydrOXYzine (ATARAX/VISTARIL) 25 MG tablet Take 25 mg (1 tablet) three times daily for anxiety 10/29/13   Armandina StammerNwoko, Agnes I, NP  lidocaine (LMX) 4 % cream Apply 1 application topically 3 (three) times daily as needed. 03/12/17   Abeera Flannery A, PA-C  lisinopril (PRINIVIL,ZESTRIL) 10 MG tablet Take 1 tablet (10 mg total) by mouth daily. For hypertension 10/29/13   Armandina StammerNwoko, Agnes I, NP  nicotine (NICODERM CQ - DOSED IN MG/24 HOURS) 21  mg/24hr patch Place 1 patch (21 mg total) onto the skin daily. For nicotine addiction 10/29/13   Armandina StammerNwoko, Agnes I, NP  zolpidem (AMBIEN) 10 MG tablet Take 1 tablet (10 mg total) by mouth at bedtime as needed for sleep. 10/29/13 11/28/13  Sanjuana KavaNwoko, Agnes I, NP    Family History No family history on file.  Social History Social History  Substance Use Topics  . Smoking status: Current Every Day Smoker    Packs/day: 1.00    Types: Cigarettes  . Smokeless tobacco: Never Used  . Alcohol use 60.0 oz/week    100 Cans of beer per week     Comment: daily, heavy at times pt states 4-5 40oz at a time     Allergies   Sulfur   Review of Systems Review of Systems  Constitutional: Negative for chills and fever.  Respiratory: Positive for cough and shortness of breath. Negative for wheezing.   Cardiovascular: Positive for chest pain.  Gastrointestinal: Positive for nausea. Negative for abdominal pain and vomiting.     Physical Exam Updated Vital Signs BP (!) 162/115 (BP Location: Right Arm)   Pulse 97   Temp (!) 97.5 F (36.4 C) (Oral)   Resp (!) 22   Ht 5\' 5"  (1.651 m)   Wt 65.8 kg (145 lb)   LMP 02/21/2017  SpO2 99%   BMI 24.13 kg/m   Physical Exam  Constitutional: She appears well-developed and well-nourished. No distress.  Resting in chair, appears somewhat uncomfortable, clutching her left side  HENT:  Head: Normocephalic and atraumatic.  Right Ear: External ear normal.  Left Ear: External ear normal.  Mouth/Throat: Oropharynx is clear and moist.  Eyes: Conjunctivae are normal. Right eye exhibits no discharge. Left eye exhibits no discharge.  Neck: Normal range of motion. Neck supple. No JVD present. No tracheal deviation present.  Cardiovascular: Normal rate, regular rhythm and normal heart sounds.  Exam reveals no gallop and no friction rub.   No murmur heard. Pulmonary/Chest: Breath sounds normal. No stridor. No respiratory distress. She has no wheezes. She has no rales.  She exhibits tenderness.  Left lateral chest wall tender to palpation from the mid axillary region down to the costal margin. No ecchymosis or swelling noted. Equal rise and fall of chest, no increased work of breathing, no paradoxical wall motion, no crepitus or deformity noted.  Abdominal: Soft. Bowel sounds are normal. She exhibits no distension. There is no tenderness.  Musculoskeletal: She exhibits no edema.  Neurological: She is alert.  Skin: Skin is warm and dry. No erythema.  Psychiatric: She has a normal mood and affect. Her behavior is normal.  Nursing note and vitals reviewed.    ED Treatments / Results  Labs (all labs ordered are listed, but only abnormal results are displayed) Labs Reviewed - No data to display  EKG  EKG Interpretation None       Radiology Dg Chest 2 View  Result Date: 03/12/2017 CLINICAL DATA:  Fall, rib injury.  Left chest pain EXAM: CHEST  2 VIEW COMPARISON:  None. FINDINGS: The heart size and mediastinal contours are within normal limits. Both lungs are clear. The visualized skeletal structures are unremarkable. IMPRESSION: No active cardiopulmonary disease. Electronically Signed   By: Marlan Palau M.D.   On: 03/12/2017 08:50    Procedures Procedures (including critical care time)  Medications Ordered in ED Medications  ketorolac (TORADOL) injection 30 mg (30 mg Intramuscular Given 03/12/17 0931)     Initial Impression / Assessment and Plan / ED Course  I have reviewed the triage vital signs and the nursing notes.  Pertinent labs & imaging results that were available during my care of the patient were reviewed by me and considered in my medical decision making (see chart for details).     Patient with chest wall pain reproducible on palpation secondary to fall 2 days ago. Afebrile, she is hypertensive but has not been taking her blood pressure medications. SPO2 99% on room air. She is taking shallow breaths due to discomfort on deep  inspiration. Chest x-ray reviewed by me shows no acute cardiopulmonary disease including no evidence of pneumothorax or pneumonia. No rib fractures seen on radiographs. I doubt ACS or MI given a traumatic setting is the cause of her symptoms. She is stable for discharge home with incentive spirometer, lidocaine cream, NSAIDs and Tylenol. Recommend follow-up with primary care physician for reevaluation of her symptoms in one week as well as reevaluation of her hypertension. Discussed indications for return to the ED. Patient and patient's fianc verbalized understanding of and agreement with plan and patient is stable for discharge home at this time.  Final Clinical Impressions(s) / ED Diagnoses   Final diagnoses:  Chest wall pain    New Prescriptions New Prescriptions   LIDOCAINE (LMX) 4 % CREAM    Apply 1  application topically 3 (three) times daily as needed.     Jeanie Sewer, PA-C 03/12/17 1610    Tilden Fossa, MD 03/12/17 1032

## 2017-03-12 NOTE — ED Notes (Signed)
See edp assessment 

## 2017-03-12 NOTE — ED Triage Notes (Signed)
Per Pt, Pt was walking backwards through her house during moving, and she tripped and fell over a chair. Reports hitting left ribs on the arm of a chair two days ago. Pt complains of pain to the left lower rib cage that increases with breathing. Lung sounds equal and clear.

## 2017-07-27 ENCOUNTER — Encounter (HOSPITAL_COMMUNITY): Payer: Self-pay | Admitting: Emergency Medicine

## 2017-07-27 ENCOUNTER — Emergency Department (HOSPITAL_COMMUNITY)
Admission: EM | Admit: 2017-07-27 | Discharge: 2017-07-27 | Disposition: A | Discharge status: Home / Self Care | Payer: Self-pay | Attending: Emergency Medicine | Admitting: Emergency Medicine

## 2017-07-27 ENCOUNTER — Other Ambulatory Visit: Payer: Self-pay

## 2017-07-27 DIAGNOSIS — H1089 Other conjunctivitis: Secondary | ICD-10-CM | POA: Insufficient documentation

## 2017-07-27 DIAGNOSIS — Z79899 Other long term (current) drug therapy: Secondary | ICD-10-CM | POA: Insufficient documentation

## 2017-07-27 DIAGNOSIS — F17210 Nicotine dependence, cigarettes, uncomplicated: Secondary | ICD-10-CM | POA: Insufficient documentation

## 2017-07-27 DIAGNOSIS — H1033 Unspecified acute conjunctivitis, bilateral: Secondary | ICD-10-CM

## 2017-07-27 DIAGNOSIS — L03213 Periorbital cellulitis: Secondary | ICD-10-CM | POA: Insufficient documentation

## 2017-07-27 LAB — I-STAT BETA HCG BLOOD, ED (MC, WL, AP ONLY)

## 2017-07-27 MED ORDER — FLUORESCEIN SODIUM 1 MG OP STRP
1.0000 | ORAL_STRIP | Freq: Once | OPHTHALMIC | Status: AC
  Administered 2017-07-27: 1 via OPHTHALMIC
  Filled 2017-07-27: qty 1

## 2017-07-27 MED ORDER — CLINDAMYCIN HCL 150 MG PO CAPS: 450.0000 mg | ORAL_CAPSULE | Freq: Three times a day (TID) | ORAL | 0 refills | Status: AC

## 2017-07-27 MED ORDER — BACITRACIN-POLYMYXIN B 500-10000 UNIT/GM OP OINT: 1.0000 "application " | TOPICAL_OINTMENT | Freq: Two times a day (BID) | OPHTHALMIC | 0 refills | Status: DC

## 2017-07-27 MED ORDER — ACETAMINOPHEN 500 MG PO TABS
1000.0000 mg | ORAL_TABLET | Freq: Once | ORAL | Status: AC
  Administered 2017-07-27: 1000 mg via ORAL
  Filled 2017-07-27: qty 2

## 2017-07-27 MED ORDER — TETRACAINE HCL 0.5 % OP SOLN
1.0000 [drp] | Freq: Once | OPHTHALMIC | Status: AC
  Administered 2017-07-27: 1 [drp] via OPHTHALMIC
  Filled 2017-07-27: qty 4

## 2017-07-27 NOTE — ED Notes (Signed)
Updated pt on wait for treatment room. 

## 2017-07-27 NOTE — ED Provider Notes (Signed)
[facility] Provider Note   CSN: [id number] Arrival date & time: 07/27/17  1408     History   Chief Complaint Chief Complaint  Patient presents with  . Conjunctivitis    HPI Shelly Tucker is a 37 y.o. female evaluation of bilateral eye redness, discharge.  Patient reports that she has had these symptoms for 1 week.  She states that initially symptoms began in the right eye.  She states that she noticed some yellow drainage from the eye.  She states that she would wake up in the morning to have the eye crusted down.  Reports that the symptoms soon spread to both eyes.  He does report some redness around the right periorbital region.  Patient does not wear glasses or contacts.  Patient denies any fevers, vision changes. Patinet denies any STD history or any vaginal discharge.   The history is provided by the patient.    Past Medical History:  Diagnosis Date  . Anxiety   . History of ETOH abuse   . Laceration of digital nerve of finger    right small finger    Patient Active Problem List   Diagnosis Date Noted  . Alcohol dependence (HCC) 10/24/2013    Past Surgical History:  Procedure Laterality Date  . Arm surgery       OB History    No data available       Home Medications    Prior to Admission medications   Medication Sig Start Date End Date Taking? Authorizing Provider  bacitracin-polymyxin b (POLYSPORIN) ophthalmic ointment Place 1 application into both eyes every 12 (twelve) hours. apply to eye every 12 hours while awake 07/27/17   Graciella FreerLayden, Anali Cabanilla A, PA-C  clindamycin (CLEOCIN) 150 MG capsule Take 3 capsules (450 mg total) by mouth 3 (three) times daily for 7 days. 07/27/17 08/03/17  Maxwell CaulLayden, Nora Rooke A, PA-C  hydrOXYzine (ATARAX/VISTARIL) 25 MG tablet Take 25 mg (1 tablet) three times daily for anxiety 10/29/13   Armandina StammerNwoko, Agnes I, NP  lidocaine (LMX) 4 % cream Apply 1 application topically 3 (three) times daily as needed. 03/12/17    Fawze, Mina A, PA-C  lisinopril (PRINIVIL,ZESTRIL) 10 MG tablet Take 1 tablet (10 mg total) by mouth daily. For hypertension 10/29/13   Armandina StammerNwoko, Agnes I, NP  nicotine (NICODERM CQ - DOSED IN MG/24 HOURS) 21 mg/24hr patch Place 1 patch (21 mg total) onto the skin daily. For nicotine addiction 10/29/13   Armandina StammerNwoko, Agnes I, NP  zolpidem (AMBIEN) 10 MG tablet Take 1 tablet (10 mg total) by mouth at bedtime as needed for sleep. 10/29/13 11/28/13  Sanjuana KavaNwoko, Agnes I, NP    Family History History reviewed. No pertinent family history.  Social History Social History   Tobacco Use  . Smoking status: Current Every Day Smoker    Packs/day: 1.00    Types: Cigarettes  . Smokeless tobacco: Never Used  Substance Use Topics  . Alcohol use: Yes    Alcohol/week: 60.0 oz    Types: 100 Cans of beer per week    Comment: daily, heavy at times pt states 4-5 40oz at a time  . Drug use: Yes    Types: Cocaine, Marijuana    Comment: marijuana     Allergies   Sulfur   Review of Systems Review of Systems  Constitutional: Negative for fever.  Eyes: Positive for pain, discharge and redness. Negative for visual disturbance.     Physical Exam Updated Vital Signs BP 126/85  Pulse 82   Temp (!) 97.4 F (36.3 C) (Oral)   Resp 17   LMP 07/21/2017   SpO2 98%   Physical Exam  Constitutional: She appears well-developed and well-nourished.  HENT:  Head: Normocephalic and atraumatic.  Eyes: EOM are normal. Pupils are equal, round, and reactive to light. Right eye exhibits discharge. Left eye exhibits discharge. Right conjunctiva is injected. Left conjunctiva is injected. No scleral icterus.  Bilateral eyes with conjunctival injection, R>L. Discharge noted to bilateral eyes. EOMs intact without pain or difficulty. Some diffuse erythema and tenderness surrounding the right periobital region. No abnormalities of the left periorbital region.   Pulmonary/Chest: Effort normal.  Neurological: She is alert.  Skin: Skin  is warm and dry.  Psychiatric: She has a normal mood and affect. Her speech is normal and behavior is normal.  Nursing note and vitals reviewed.       ED Treatments / Results  Labs (all labs ordered are listed, but only abnormal results are displayed) Labs Reviewed  I-STAT BETA HCG BLOOD, ED ([facility], [facility], [facility] ONLY)    EKG  EKG Interpretation None       Radiology No results found.  Procedures Procedures (including critical care time)  Medications Ordered in ED Medications  fluorescein ophthalmic strip 1 strip (1 strip Both Eyes Given 07/27/17 1640)  tetracaine (PONTOCAINE) 0.5 % ophthalmic solution 1 drop (1 drop Both Eyes Given 07/27/17 1640)     Initial Impression / Assessment and Plan / ED Course  I have reviewed the triage vital signs and the nursing notes.  Pertinent labs & imaging results that were available during my care of the patient were reviewed by me and considered in my medical decision making (see chart for details).     37 y.o. F who presents for evaluation of bilateral eye pain, redness, discharge.  Patient reports that symptoms started with her right eye spread to her left.  Has woken up with discharge matted to the eye. Patient is afebrile, non-toxic appearing, sitting comfortably on examination table. Vital signs reviewed and stable.  On exam, patient has bilateral conjunctival injection, right greater than left.  Does have some surrounding tenderness and intact without pain or difficulty.  Concern for both bacterial conjunctivitis and preseptal cellulitis of the right periorbital region.  Patient has good EOMs without pain or difficulty.  She is able to move the redirection.  Physical exam is not concerning for orbital cellulitis.  Woods lamp evaluation reveals no fluorescein uptake, evidence of corneal abrasion, dendritic lesion. IOP and Visual acuity as documented below.  Clinical vocal patient comes in for follow-up of report for preseptal cellulitis and  possible conjunctivitis.  Plan to give ophthalmology referral for patient to follow-up with for further evaluation. Patient had ample opportunity for questions and discussion. All patient's questions were answered with full understanding. Strict return precautions discussed. Patient expresses understanding and agreement to plan.    Right IOP: 18 Left IOP: 18      Visual Acuity  Right Eye Distance: 20/25 Left Eye Distance: 25/25 Bilateral Distance: 20/20  Right Eye Near:   Left Eye Near:    Bilateral Near:     Final Clinical Impressions(s) / ED Diagnoses   Final diagnoses:  Acute bacterial conjunctivitis of both eyes  Preseptal cellulitis    ED Discharge Orders        Ordered    clindamycin (CLEOCIN) 150 MG capsule  3 times daily     07/27/17 1714    bacitracin-polymyxin  b (POLYSPORIN) ophthalmic ointment  Every 12 hours     07/27/17 1714       Rosana Hoes 07/27/17 1733    LongArlyss Repress, MD 07/28/17 [phone]

## 2017-07-27 NOTE — Discharge Instructions (Addendum)
Take antibiotics as directed. Please take all of your antibiotics until finished.  Use antibiotic eyedrops as directed.   As we discussed, you need to follow-up with the referred eye doctor if you have no improvement in the next 48 hours.   Return to the Emergency Department for any fever, worsening eye pain, vision changes, or any other worsening or concerning symptoms.

## 2017-07-27 NOTE — ED Triage Notes (Signed)
Pt to ER for evaluation of right eye conjunctivitis onset last Wednesday with drainage.

## 2017-09-13 ENCOUNTER — Encounter (HOSPITAL_COMMUNITY): Payer: Self-pay

## 2017-09-20 ENCOUNTER — Other Ambulatory Visit (HOSPITAL_COMMUNITY): Payer: Self-pay | Admitting: *Deleted

## 2017-09-20 ENCOUNTER — Ambulatory Visit (HOSPITAL_COMMUNITY)
Admission: RE | Admit: 2017-09-20 | Discharge: 2017-09-20 | Disposition: A | Discharge status: Home / Self Care | Payer: Self-pay | Source: Ambulatory Visit | Attending: Obstetrics and Gynecology | Admitting: Obstetrics and Gynecology

## 2017-09-20 ENCOUNTER — Encounter (HOSPITAL_COMMUNITY): Payer: Self-pay

## 2017-09-20 DIAGNOSIS — Z1239 Encounter for other screening for malignant neoplasm of breast: Secondary | ICD-10-CM

## 2017-09-20 DIAGNOSIS — N6320 Unspecified lump in the left breast, unspecified quadrant: Secondary | ICD-10-CM

## 2017-09-20 DIAGNOSIS — N6322 Unspecified lump in the left breast, upper inner quadrant: Secondary | ICD-10-CM

## 2017-09-20 HISTORY — DX: Bipolar disorder, unspecified: F31.9

## 2017-09-20 NOTE — Patient Instructions (Signed)
Explained breast self awareness with Shelly Tucker. Patient did not need a Pap smear today due to last Pap smear was 08/23/2017. Explained the colposcopy the recommended follow-up for her abnormal Pap smear. [facility] a colpos[facility] and endometrial biopsy to follow-up for abnormal Pap smear. Appointment scheduled for Friday, Shelly Tucker 12, 2019 at 0855. [facility] for a diagnostic mamm[facility] and left breast ultrasound. Appointment scheduled for Thursday, September 22, 2017 at 1100. Patient aware of appointments and will be there. Discussed smoking cessation with patient. [facility] the [facility] and gave resources to the free smoking cessation classes at [facility]. Shelly Tucker verbalized understanding.  [place]annock, Kathaleen Maserhristine Poll, RN 3:14 PM

## 2017-09-20 NOTE — Progress Notes (Signed)
[facility] due to having an abnormal Pap smear 08/23/2017 that a colposcopy is recommended for follow-up.   Pap Smear: Pap smear not completed today. [facility] and [facility] with more advanced lesions maybe present atypical glandular cells. Ref[facility] to the [facility] for a colposcopy and endometrial biopsy to follow-up for abnormal Pap smear. Appointment scheduled for Friday, Amey 12, 2019 at 0855. Per patient has a history of one other abnormal Pap smear in 2012 that no follow-up was completed. Last Pap smear result is in Epic.  Physical exam: Breasts Breasts symmetrical. No skin abnormalities right breast. A bruise observed on the left breast at 10 o'clock 5 cm from the nipple. No nipple retraction bilateral breasts. No nipple discharge bilateral breasts. No lymphadenopathy. No lumps palpated right breast. Palpated a lump within the left breast at 10 o'clock 5 cm from the nipple. No complaints of pain or tenderness on exam. [facility] for a diagnostic mammogram and left breast ultrasound. Appointment scheduled for Thursday, September 22, 2017 at 1100.        Pelvic/Bimanual No Pap smear completed today since last Pap smear was 08/23/2017. Pap smear not indicated per [facility] guidelines.   Smoking History: Patient is a current smoker. Discussed smoking cessation with patient. [facility] the [facility] and gave resources to the free smoking cessation classes at [facility].  Patient Navigation: Patient education provided. Access to services provided for patient through [facility] program.   Breast and Cervical Cancer Risk Assessment: Patient has no family history of breast cancer, known genetic mutations, or radiation treatment to the chest before age 35. Patient has no history of cervical dysplasia, immunocompromised, or DES  exposure in-utero.

## 2017-09-22 ENCOUNTER — Ambulatory Visit
Admission: RE | Admit: 2017-09-22 | Discharge: 2017-09-22 | Disposition: A | Discharge status: Home / Self Care | Payer: Self-pay | Source: Ambulatory Visit | Attending: Obstetrics and Gynecology | Admitting: Obstetrics and Gynecology

## 2017-09-22 ENCOUNTER — Ambulatory Visit
Admission: RE | Admit: 2017-09-22 | Discharge: 2017-09-22 | Disposition: A | Discharge status: Home / Self Care | Payer: No Typology Code available for payment source | Source: Ambulatory Visit | Attending: Obstetrics and Gynecology | Admitting: Obstetrics and Gynecology

## 2017-09-22 ENCOUNTER — Other Ambulatory Visit (HOSPITAL_COMMUNITY): Payer: Self-pay | Admitting: Obstetrics and Gynecology

## 2017-09-22 DIAGNOSIS — N6320 Unspecified lump in the left breast, unspecified quadrant: Secondary | ICD-10-CM

## 2017-09-26 ENCOUNTER — Encounter (HOSPITAL_COMMUNITY): Payer: Self-pay | Admitting: *Deleted

## 2017-10-14 ENCOUNTER — Other Ambulatory Visit (HOSPITAL_COMMUNITY)
Admission: RE | Admit: 2017-10-14 | Discharge: 2017-10-14 | Disposition: A | Discharge status: Home / Self Care | Payer: Self-pay | Source: Ambulatory Visit | Attending: Obstetrics & Gynecology | Admitting: Obstetrics & Gynecology

## 2017-10-14 ENCOUNTER — Ambulatory Visit (INDEPENDENT_AMBULATORY_CARE_PROVIDER_SITE_OTHER): Payer: No Typology Code available for payment source | Admitting: Obstetrics & Gynecology

## 2017-10-14 ENCOUNTER — Encounter: Payer: Self-pay | Admitting: Obstetrics & Gynecology

## 2017-10-14 VITALS — BP 138/91 | HR 86 | Ht 65.0 in | Wt 145.0 lb

## 2017-10-14 DIAGNOSIS — R87619 Unspecified abnormal cytological findings in specimens from cervix uteri: Secondary | ICD-10-CM

## 2017-10-14 DIAGNOSIS — R87610 Atypical squamous cells of undetermined significance on cytologic smear of cervix (ASC-US): Secondary | ICD-10-CM

## 2017-10-14 DIAGNOSIS — R87810 Cervical high risk human papillomavirus (HPV) DNA test positive: Secondary | ICD-10-CM

## 2017-10-14 LAB — POCT PREGNANCY, URINE: Preg Test, Ur: NEGATIVE

## 2017-10-14 NOTE — Progress Notes (Signed)
   Subjective:    Patient ID: Shelly Tucker, female    DOB: September 27, 1980, 37 y.o.   MRN: [id number]  HPI  37 yo single G0 here for a colpo and EMBX due to ASCUS and AGUS 2/19.   Review of Systems Of note, she has monthly periods, has never used contraception, and has never conceived.    Objective:   Physical Exam Breathing, conversing, and ambulating normally Well nourished, well hydrated White female, no apparent distress Fairly nervous about the procedure UPT negative, consent signed, time out done Cervix prepped with acetic acid. Transformation zone seen in its entirety. Colpo adequate. 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 I biopsied this area ECC obtained. I then did a EMBX, 2 passes, moderate amount of tissue. She tolerated the procedure well.      Assessment & Plan:  ASCUS, AGUS- await pathology

## 2017-10-18 ENCOUNTER — Encounter: Payer: Self-pay | Admitting: *Deleted

## 2017-10-24 ENCOUNTER — Telehealth: Payer: Self-pay | Admitting: General Practice

## 2017-10-24 NOTE — Telephone Encounter (Signed)
Left message for patient to give our office a call back to schedule appt.

## 2017-10-25 ENCOUNTER — Telehealth: Payer: Self-pay

## 2017-10-25 NOTE — Telephone Encounter (Signed)
Pt called requesting test results. 

## 2017-10-26 ENCOUNTER — Ambulatory Visit (INDEPENDENT_AMBULATORY_CARE_PROVIDER_SITE_OTHER): Payer: Self-pay | Admitting: Obstetrics & Gynecology

## 2017-10-26 ENCOUNTER — Telehealth (HOSPITAL_COMMUNITY): Payer: Self-pay | Admitting: *Deleted

## 2017-10-26 DIAGNOSIS — D069 Carcinoma in situ of cervix, unspecified: Secondary | ICD-10-CM

## 2017-10-26 NOTE — Progress Notes (Signed)
   Subjective:    Patient ID: Shelly Tucker, female    DOB: 1981-02-26, 37 y.o.   MRN: [id number]  HPI 37 yo single G0 here to discuss her pathology report. She had a pap that showed AGUS and ASCUS. 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 Her EMBX showed normal uterine cells.   Review of Systems     Objective:   Physical Exam Tearful female, Tells me that she is essentially homeless, lives with her mom at times       Assessment & Plan:  Pathology as above- plan for [facility]C She will need to fill out financial aid forms for this. I believe that a CKC is better for her than a LEEP.

## 2017-10-26 NOTE — Telephone Encounter (Signed)
Patient came into office and concerns were addressed in person

## 2017-10-26 NOTE — Telephone Encounter (Signed)
Telephoned patient at home number and left message to return call to [facility] 

## 2017-10-27 ENCOUNTER — Encounter (HOSPITAL_COMMUNITY): Payer: Self-pay

## 2017-10-31 ENCOUNTER — Telehealth: Payer: Self-pay | Admitting: General Practice

## 2017-10-31 NOTE — Telephone Encounter (Signed)
Patient called and left message on nurse voicemail line stating she has been trying to call all week. Patient is requesting a call back. Called patient, no answer- left message stating we are trying to reach you to return your phone call, please call us back if you still need assistance.

## 2017-11-01 ENCOUNTER — Encounter (HOSPITAL_COMMUNITY): Payer: Self-pay

## 2017-11-01 ENCOUNTER — Other Ambulatory Visit (HOSPITAL_COMMUNITY): Payer: Self-pay

## 2017-11-01 ENCOUNTER — Encounter (HOSPITAL_COMMUNITY): Payer: Self-pay | Admitting: Anesthesiology

## 2017-11-01 ENCOUNTER — Encounter (HOSPITAL_COMMUNITY)
Admission: RE | Disposition: A | Discharge status: Home / Self Care | Payer: Self-pay | Source: Ambulatory Visit | Attending: Obstetrics & Gynecology

## 2017-11-01 ENCOUNTER — Ambulatory Visit (HOSPITAL_COMMUNITY)
Admission: RE | Admit: 2017-11-01 | Discharge: 2017-11-01 | Disposition: A | Discharge status: Home / Self Care | Payer: Self-pay | Source: Ambulatory Visit | Attending: Obstetrics & Gynecology | Admitting: Obstetrics & Gynecology

## 2017-11-01 SURGERY — CONE BIOPSY, CERVIX
Anesthesia: Choice

## 2017-11-01 MED ORDER — SCOPOLAMINE 1 MG/3DAYS TD PT72
MEDICATED_PATCH | TRANSDERMAL | Status: AC
  Filled 2017-11-01: qty 1

## 2017-11-01 MED ORDER — SCOPOLAMINE 1 MG/3DAYS TD PT72: 1.0000 | MEDICATED_PATCH | Freq: Once | TRANSDERMAL | Status: DC

## 2017-11-01 MED ORDER — LACTATED RINGERS IV SOLN: INTRAVENOUS | Status: DC

## 2017-11-01 NOTE — Anesthesia Preprocedure Evaluation (Deleted)
Anesthesia Evaluation    Airway        Dental   Pulmonary Current Smoker,           Cardiovascular      Neuro/Psych    GI/Hepatic   Endo/Other    Renal/GU      Musculoskeletal   Abdominal   Peds  Hematology   Anesthesia Other Findings   Reproductive/Obstetrics                             Anesthesia Physical Anesthesia Plan  ASA:   Anesthesia Plan:    Post-op Pain Management:    Induction:   PONV Risk Score and Plan:   Airway Management Planned:   Additional Equipment:   Intra-op Plan:   Post-operative Plan:   Informed Consent:   Plan Discussed with:   Anesthesia Plan Comments: (Patient presented on DOS with smell of alcohol on her breath. Appeared intoxicated. Discussed with Dr. 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  She will need to abstain from ETOH for 24 hours prior to her scheduled surgery.)        Anesthesia Quick Evaluation

## 2017-11-01 NOTE — H&P (Addendum)
Per observation of the nurses and Dr. Malen Gauze and myself, she smelled of alcohol and appeared to be somewhat impaired. I explained to her that for her safety, her surgery would be rescheduled. She understood. She agrees that she will not drink for 24 hours prior to her next surgery. She assures me that this is possible.

## 2017-11-02 ENCOUNTER — Telehealth (HOSPITAL_COMMUNITY): Payer: Self-pay | Admitting: *Deleted

## 2017-11-02 NOTE — Telephone Encounter (Signed)
Telephoned patient at home number and advised patient would need to fill out [facility] Medicaid paperwork to pay for CKC. Patient will meet with me on Friday May 3 1:30. Advised patient if missed appointment would be responsible for bill. Patient voiced understanding.

## 2017-11-07 ENCOUNTER — Encounter (HOSPITAL_COMMUNITY): Payer: Self-pay | Admitting: *Deleted

## 2017-11-11 ENCOUNTER — Ambulatory Visit: Payer: Self-pay | Admitting: Obstetrics & Gynecology

## 2017-11-15 NOTE — Anesthesia Preprocedure Evaluation (Addendum)
Anesthesia Evaluation  Patient identified by MRN, date of birth, ID band Patient awake    Reviewed: Allergy & Precautions, NPO status , Patient's Chart, lab work & pertinent test results  Airway Mallampati: II  TM Distance: >3 FB Neck ROM: Full    Dental no notable dental hx. (+) Teeth Intact, Dental Advisory Given   Pulmonary Current Smoker,    Pulmonary exam normal breath sounds clear to auscultation       Cardiovascular negative cardio ROS Normal cardiovascular exam Rhythm:Regular Rate:Normal     Neuro/Psych negative neurological ROS  negative psych ROS   GI/Hepatic negative GI ROS, Neg liver ROS,   Endo/Other  negative endocrine ROS  Renal/GU negative Renal ROS  negative genitourinary   Musculoskeletal negative musculoskeletal ROS (+)   Abdominal   Peds negative pediatric ROS (+)  Hematology negative hematology ROS (+)   Anesthesia Other Findings   Reproductive/Obstetrics negative OB ROS                             Anesthesia Physical Anesthesia Plan  ASA: II  Anesthesia Plan: General   Post-op Pain Management:    Induction: Intravenous  PONV Risk Score and Plan: 3 and Treatment may vary due to age or medical condition, Ondansetron, Dexamethasone and Scopolamine patch - Pre-op  Airway Management Planned: LMA  Additional Equipment:   Intra-op Plan:   Post-operative Plan:   Informed Consent: I have reviewed the patients History and Physical, chart, labs and discussed the procedure including the risks, benefits and alternatives for the proposed anesthesia with the patient or authorized representative who has indicated his/her understanding and acceptance.     Plan Discussed with: CRNA  Anesthesia Plan Comments:         Anesthesia Quick Evaluation

## 2017-11-16 ENCOUNTER — Encounter (HOSPITAL_COMMUNITY)
Admission: RE | Disposition: A | Discharge status: Home / Self Care | Payer: Self-pay | Source: Ambulatory Visit | Attending: Obstetrics & Gynecology

## 2017-11-16 ENCOUNTER — Ambulatory Visit (HOSPITAL_COMMUNITY): Payer: Self-pay | Admitting: Anesthesiology

## 2017-11-16 ENCOUNTER — Other Ambulatory Visit: Payer: Self-pay

## 2017-11-16 ENCOUNTER — Encounter (HOSPITAL_COMMUNITY): Payer: Self-pay

## 2017-11-16 ENCOUNTER — Ambulatory Visit (HOSPITAL_COMMUNITY)
Admission: RE | Admit: 2017-11-16 | Discharge: 2017-11-16 | Disposition: A | Discharge status: Home / Self Care | Payer: Self-pay | Source: Ambulatory Visit | Attending: Obstetrics & Gynecology | Admitting: Obstetrics & Gynecology

## 2017-11-16 DIAGNOSIS — D069 Carcinoma in situ of cervix, unspecified: Secondary | ICD-10-CM

## 2017-11-16 DIAGNOSIS — F17210 Nicotine dependence, cigarettes, uncomplicated: Secondary | ICD-10-CM | POA: Insufficient documentation

## 2017-11-16 DIAGNOSIS — F319 Bipolar disorder, unspecified: Secondary | ICD-10-CM | POA: Insufficient documentation

## 2017-11-16 DIAGNOSIS — F419 Anxiety disorder, unspecified: Secondary | ICD-10-CM | POA: Insufficient documentation

## 2017-11-16 DIAGNOSIS — Z7982 Long term (current) use of aspirin: Secondary | ICD-10-CM | POA: Insufficient documentation

## 2017-11-16 DIAGNOSIS — Z79899 Other long term (current) drug therapy: Secondary | ICD-10-CM | POA: Insufficient documentation

## 2017-11-16 DIAGNOSIS — D060 Carcinoma in situ of endocervix: Secondary | ICD-10-CM | POA: Insufficient documentation

## 2017-11-16 HISTORY — PX: CERVICAL CONIZATION W/BX: SHX1330

## 2017-11-16 LAB — CBC
HCT: 46.3 % — ABNORMAL HIGH (ref 36.0–46.0)
Hemoglobin: 15.9 g/dL — ABNORMAL HIGH (ref 12.0–15.0)
MCH: 35.8 pg — ABNORMAL HIGH (ref 26.0–34.0)
MCHC: 34.3 g/dL (ref 30.0–36.0)
MCV: 104.3 fL — ABNORMAL HIGH (ref 78.0–100.0)
Platelets: 170 10*3/uL (ref 150–400)
RBC: 4.44 MIL/uL (ref 3.87–5.11)
RDW: 12.5 % (ref 11.5–15.5)
WBC: 6.8 10*3/uL (ref 4.0–10.5)

## 2017-11-16 LAB — BASIC METABOLIC PANEL
Anion gap: 13 (ref 5–15)
BUN: 7 mg/dL (ref 6–20)
CO2: 23 mmol/L (ref 22–32)
Calcium: 9.4 mg/dL (ref 8.9–10.3)
Chloride: 100 mmol/L — ABNORMAL LOW (ref 101–111)
Creatinine, Ser: 0.79 mg/dL (ref 0.44–1.00)
GFR calc Af Amer: >60 mL/min (ref >60)
GFR calc non Af Amer: >60 mL/min (ref >60)
Glucose, Bld: 126 mg/dL — ABNORMAL HIGH (ref 65–99)
Potassium: 3.6 mmol/L (ref 3.5–5.1)
Sodium: 136 mmol/L (ref 135–145)

## 2017-11-16 LAB — PREGNANCY, URINE: Preg Test, Ur: NEGATIVE

## 2017-11-16 SURGERY — CONE BIOPSY, CERVIX
Anesthesia: General | Site: Vagina

## 2017-11-16 MED ORDER — BUPIVACAINE HCL (PF) 0.5 % IJ SOLN
INTRAMUSCULAR | Status: AC
  Filled 2017-11-16: qty 30

## 2017-11-16 MED ORDER — ONDANSETRON HCL 4 MG/2ML IJ SOLN
INTRAMUSCULAR | Status: AC
  Filled 2017-11-16: qty 2

## 2017-11-16 MED ORDER — ACETAMINOPHEN 10 MG/ML IV SOLN: 1000.0000 mg | Freq: Once | INTRAVENOUS | Status: DC | PRN

## 2017-11-16 MED ORDER — DEXAMETHASONE SODIUM PHOSPHATE 10 MG/ML IJ SOLN
INTRAMUSCULAR | Status: AC
  Filled 2017-11-16: qty 1

## 2017-11-16 MED ORDER — BUPIVACAINE HCL (PF) 0.5 % IJ SOLN
INTRAMUSCULAR | Status: DC | PRN
  Administered 2017-11-16: 10 mL

## 2017-11-16 MED ORDER — HYDROMORPHONE HCL 1 MG/ML IJ SOLN
INTRAMUSCULAR | Status: AC
  Filled 2017-11-16: qty 0.5

## 2017-11-16 MED ORDER — IBUPROFEN 600 MG PO TABS: 600.0000 mg | ORAL_TABLET | Freq: Four times a day (QID) | ORAL | 1 refills | Status: AC | PRN

## 2017-11-16 MED ORDER — FENTANYL CITRATE (PF) 250 MCG/5ML IJ SOLN
INTRAMUSCULAR | Status: DC | PRN
  Administered 2017-11-16: 100 ug via INTRAVENOUS
  Administered 2017-11-16 (×2): 50 ug via INTRAVENOUS

## 2017-11-16 MED ORDER — IODINE STRONG (LUGOLS) 5 % PO SOLN
ORAL | Status: AC
  Filled 2017-11-16: qty 1

## 2017-11-16 MED ORDER — MIDAZOLAM HCL 2 MG/2ML IJ SOLN
INTRAMUSCULAR | Status: DC | PRN
  Administered 2017-11-16: 2 mg via INTRAVENOUS

## 2017-11-16 MED ORDER — KETOROLAC TROMETHAMINE 30 MG/ML IJ SOLN
INTRAMUSCULAR | Status: DC | PRN
  Administered 2017-11-16: 30 mg via INTRAVENOUS

## 2017-11-16 MED ORDER — DEXAMETHASONE SODIUM PHOSPHATE 4 MG/ML IJ SOLN
INTRAMUSCULAR | Status: DC | PRN
  Administered 2017-11-16: 10 mg via INTRAVENOUS

## 2017-11-16 MED ORDER — ONDANSETRON HCL 4 MG/2ML IJ SOLN
INTRAMUSCULAR | Status: DC | PRN
  Administered 2017-11-16: 4 mg via INTRAVENOUS

## 2017-11-16 MED ORDER — MEPERIDINE HCL 25 MG/ML IJ SOLN: 6.2500 mg | INTRAMUSCULAR | Status: DC | PRN

## 2017-11-16 MED ORDER — GABAPENTIN 300 MG PO CAPS
300.0000 mg | ORAL_CAPSULE | Freq: Once | ORAL | Status: AC
  Administered 2017-11-16: 300 mg via ORAL

## 2017-11-16 MED ORDER — MIDAZOLAM HCL 2 MG/2ML IJ SOLN
INTRAMUSCULAR | Status: AC
  Filled 2017-11-16: qty 2

## 2017-11-16 MED ORDER — HYDROCODONE-ACETAMINOPHEN 7.5-325 MG PO TABS
1.0000 | ORAL_TABLET | Freq: Once | ORAL | Status: AC | PRN
  Administered 2017-11-16: 1 via ORAL

## 2017-11-16 MED ORDER — LIDOCAINE HCL (CARDIAC) PF 100 MG/5ML IV SOSY
PREFILLED_SYRINGE | INTRAVENOUS | Status: AC
  Filled 2017-11-16: qty 5

## 2017-11-16 MED ORDER — FLUMAZENIL 0.5 MG/5ML IV SOLN
INTRAVENOUS | Status: AC
  Filled 2017-11-16: qty 5

## 2017-11-16 MED ORDER — PROMETHAZINE HCL 25 MG/ML IJ SOLN: 6.2500 mg | INTRAMUSCULAR | Status: DC | PRN

## 2017-11-16 MED ORDER — PROPOFOL 10 MG/ML IV BOLUS
INTRAVENOUS | Status: DC | PRN
  Administered 2017-11-16: 200 mg via INTRAVENOUS

## 2017-11-16 MED ORDER — GABAPENTIN 300 MG PO CAPS
ORAL_CAPSULE | ORAL | Status: AC
  Administered 2017-11-16: 300 mg via ORAL
  Filled 2017-11-16: qty 1

## 2017-11-16 MED ORDER — LACTATED RINGERS IV SOLN
INTRAVENOUS | Status: DC
  Administered 2017-11-16: 16:00:00 via INTRAVENOUS
  Administered 2017-11-16: 125 mL/h via INTRAVENOUS

## 2017-11-16 MED ORDER — ACETAMINOPHEN 325 MG PO TABS
650.0000 mg | ORAL_TABLET | Freq: Once | ORAL | Status: AC
  Administered 2017-11-16: 650 mg via ORAL

## 2017-11-16 MED ORDER — FENTANYL CITRATE (PF) 100 MCG/2ML IJ SOLN
INTRAMUSCULAR | Status: AC
  Filled 2017-11-16: qty 2

## 2017-11-16 MED ORDER — HYDROCODONE-ACETAMINOPHEN 7.5-325 MG PO TABS
ORAL_TABLET | ORAL | Status: AC
  Filled 2017-11-16: qty 1

## 2017-11-16 MED ORDER — ACETAMINOPHEN 325 MG PO TABS
ORAL_TABLET | ORAL | Status: AC
  Administered 2017-11-16: 650 mg via ORAL
  Filled 2017-11-16: qty 2

## 2017-11-16 MED ORDER — HYDROMORPHONE HCL 1 MG/ML IJ SOLN
0.2500 mg | INTRAMUSCULAR | Status: DC | PRN
  Administered 2017-11-16 (×4): 0.5 mg via INTRAVENOUS

## 2017-11-16 MED ORDER — SCOPOLAMINE 1 MG/3DAYS TD PT72
1.0000 | MEDICATED_PATCH | Freq: Once | TRANSDERMAL | Status: DC
  Administered 2017-11-16: 1.5 mg via TRANSDERMAL

## 2017-11-16 MED ORDER — FERRIC SUBSULFATE 259 MG/GM EX SOLN
CUTANEOUS | Status: AC
  Filled 2017-11-16: qty 8

## 2017-11-16 MED ORDER — GLYCOPYRROLATE 0.2 MG/ML IJ SOLN
INTRAMUSCULAR | Status: DC | PRN
  Administered 2017-11-16: .05 mg via INTRAVENOUS

## 2017-11-16 MED ORDER — GLYCOPYRROLATE 0.2 MG/ML IJ SOLN
INTRAMUSCULAR | Status: AC
  Filled 2017-11-16: qty 1

## 2017-11-16 MED ORDER — HYDROMORPHONE HCL 1 MG/ML IJ SOLN
INTRAMUSCULAR | Status: AC
  Filled 2017-11-16: qty 1

## 2017-11-16 MED ORDER — SCOPOLAMINE 1 MG/3DAYS TD PT72
MEDICATED_PATCH | TRANSDERMAL | Status: AC
  Administered 2017-11-16: 1.5 mg via TRANSDERMAL
  Filled 2017-11-16: qty 1

## 2017-11-16 MED ORDER — LIDOCAINE HCL (CARDIAC) PF 100 MG/5ML IV SOSY
PREFILLED_SYRINGE | INTRAVENOUS | Status: DC | PRN
  Administered 2017-11-16: 100 mg via INTRAVENOUS

## 2017-11-16 MED ORDER — IODINE STRONG (LUGOLS) 5 % PO SOLN
ORAL | Status: DC | PRN
  Administered 2017-11-16: 0.2 mL via ORAL

## 2017-11-16 MED ORDER — FLUMAZENIL 0.5 MG/5ML IV SOLN
INTRAVENOUS | Status: DC | PRN
  Administered 2017-11-16: 0.3 mg via INTRAVENOUS

## 2017-11-16 MED ORDER — PROPOFOL 10 MG/ML IV BOLUS
INTRAVENOUS | Status: AC
  Filled 2017-11-16: qty 40

## 2017-11-16 SURGICAL SUPPLY — 27 items
BLADE SURG 11 STRL SS (BLADE) ×3 IMPLANT
CLEANER TIP ELECTROSURG 2X2 (MISCELLANEOUS) ×3 IMPLANT
DILATOR CANAL MILEX (MISCELLANEOUS) IMPLANT
ELECT REM PT RETURN 9FT ADLT (ELECTROSURGICAL) ×3
ELECTRODE REM PT RTRN 9FT ADLT (ELECTROSURGICAL) ×1 IMPLANT
GLOVE BIO SURGEON STRL SZ 6.5 (GLOVE) ×2 IMPLANT
GLOVE BIO SURGEONS STRL SZ 6.5 (GLOVE) ×1
GLOVE BIOGEL PI IND STRL 6 (GLOVE) ×1 IMPLANT
GLOVE BIOGEL PI IND STRL 7.0 (GLOVE) ×1 IMPLANT
GLOVE BIOGEL PI INDICATOR 6 (GLOVE) ×2
GLOVE BIOGEL PI INDICATOR 7.0 (GLOVE) ×2
GOWN STRL REUS W/TWL LRG LVL3 (GOWN DISPOSABLE) ×6 IMPLANT
NDL SPNL 18GX3.5 QUINCKE PK (NEEDLE) ×1 IMPLANT
NEEDLE SPNL 18GX3.5 QUINCKE PK (NEEDLE) ×3 IMPLANT
PACK VAGINAL MINOR WOMEN LF (CUSTOM PROCEDURE TRAY) ×3 IMPLANT
PAD OB MATERNITY 4.3X12.25 (PERSONAL CARE ITEMS) ×3 IMPLANT
PENCIL BUTTON HOLSTER BLD 10FT (ELECTRODE) ×3 IMPLANT
SCOPETTES 8  STERILE (MISCELLANEOUS) ×2
SCOPETTES 8 STERILE (MISCELLANEOUS) ×1 IMPLANT
SPONGE SURGIFOAM ABS GEL 12-7 (HEMOSTASIS) ×2 IMPLANT
SUT VIC AB 0 CT1 27 (SUTURE) ×6
SUT VIC AB 0 CT1 27XBRD ANBCTR (SUTURE) IMPLANT
SUT VICRYL 0 27 CT2 27 ABS (SUTURE) ×2 IMPLANT
TOWEL OR 17X24 6PK STRL BLUE (TOWEL DISPOSABLE) ×3 IMPLANT
TUBING NON-CON 1/4 X 20 CONN (TUBING) ×2 IMPLANT
TUBING NON-CON 1/4 X 20' CONN (TUBING) ×1
YANKAUER SUCT BULB TIP NO VENT (SUCTIONS) ×3 IMPLANT

## 2017-11-16 NOTE — Anesthesia Postprocedure Evaluation (Signed)
Anesthesia Post Note  Patient: Shelly Tucker  Procedure(s) Performed: CONIZATION CERVIX WITH BIOPSY - COLD KNIFE (N/A Vagina )     Patient location during evaluation: PACU Anesthesia Type: General Level of consciousness: awake and alert Pain management: pain level controlled Vital Signs Assessment: post-procedure vital signs reviewed and stable Respiratory status: spontaneous breathing, nonlabored ventilation, respiratory function stable and patient connected to nasal cannula oxygen Cardiovascular status: blood pressure returned to baseline and stable Postop Assessment: no apparent nausea or vomiting Anesthetic complications: no    Last Vitals:  Vitals:   11/16/17 1700 11/16/17 1715  BP: (!) 147/95 (!) 147/101  Pulse: (!) 58 90  Resp: 13 (!) 23  Temp:    SpO2: 100% 95%    Last Pain:  Vitals:   11/16/17 1715  TempSrc:   PainSc: 9    Pain Goal: Patients Stated Pain Goal: 6 (11/16/17 1715)               Trevor Iha

## 2017-11-16 NOTE — Transfer of Care (Signed)
Immediate Anesthesia Transfer of Care Note  Patient: Shelly Tucker  Procedure(s) Performed: CONIZATION CERVIX WITH BIOPSY - COLD KNIFE (N/A Vagina )  Patient Location: PACU  Anesthesia Type:General  Level of Consciousness: awake, alert  and oriented  Airway & Oxygen Therapy: Patient Spontanous Breathing and Patient connected to nasal cannula oxygen  Post-op Assessment: Report given to RN and Post -op Vital signs reviewed and stable  Post vital signs: Reviewed and stable  Last Vitals:  Vitals Value Taken Time  BP    Temp    Pulse 59 11/16/2017  3:49 PM  Resp 8 11/16/2017  3:49 PM  SpO2 96 % 11/16/2017  3:49 PM  Vitals shown include unvalidated device data.  Last Pain:  Vitals:   11/16/17 1242  TempSrc: Oral  PainSc: 0-No pain      Patients Stated Pain Goal: 6 (11/16/17 1242)  Complications: No apparent anesthesia complications

## 2017-11-16 NOTE — Op Note (Addendum)
11/16/2017  3:23 PM  PATIENT:  Shelly Tucker  37 y.o. female  PRE-OPERATIVE DIAGNOSIS:  CIN 3  POST-OPERATIVE DIAGNOSIS:  CIN III  PROCEDURE:  Procedure(s): CONIZATION CERVIX WITH BIOPSY - COLD KNIFE (N/A)  SURGEON:  Surgeon(s) and Role:    * Okla Qazi C, MD - Primary  ANESTHESIA:   local and general  EBL:  15 mL   BLOOD ADMINISTERED:none  DRAINS: none   LOCAL MEDICATIONS USED:  MARCAINE     SPECIMEN:  Source of Specimen:  cone biopsy and ECC  DISPOSITION OF SPECIMEN:  PATHOLOGY  COUNTS:  YES  TOURNIQUET:  * No tourniquets in log *  DICTATION: .Dragon Dictation  PLAN OF CARE: Discharge to home after PACU  PATIENT DISPOSITION:  PACU - hemodynamically stable.   Delay start of Pharmacological VTE agent (>24hrs) due to surgical blood loss or risk of bleeding: not applicable  The risks, benefits, and alternatives of surgery were explained, understood, and accepted. She was taken to the operating room and placed in the dorsal lithotomy position. Her vagina was prepped and draped in the usual sterile fashion. A timeout was done. A bimanual exam revealed a normal size and shape, anteverted mobile uterus. Nonenlarged adnexa were noted. She has a moderate pubic arch, mild decensus of uterus.   A weighted speculum was placed in the posterior aspect the vagina and the cervix was visualized. Lugol's solution was generously applied to the cervix. There was a small amount of  non-staining area heading into the cervical os. The anterior lip of the cervix was grasped with a single-tooth tenaculum, and a paracervical block was done with 10 mL of 0.5% Marcaine. A cone shaped specimen of the cervix was removed and endocervical curettings were obtained. The cone bed was cauterized with the Bovie. Hemostasis was noted. A small piece of gelfoam was placed on the cone bed.  A pursestring closure of the cervix was done with a 0 Vicryl suture. Excellent hemostasis was noted. The instrument sponge,  and needle counts were correct. She was taken to the recovery room in stable condition. She tolerated the procedure well.

## 2017-11-16 NOTE — H&P (Addendum)
Shelly Tucker is an 37 y.o. female single G0 here to have a CKC and ECC. She had a pap that showed AGUS and ASCUS. 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 Her EMBX showed normal uterine cells.  Patient's last menstrual period was 11/16/2017 (exact date).    Past Medical History:  Diagnosis Date  . Anxiety   . Bipolar 1 disorder (HCC)   . History of ETOH abuse   . Laceration of digital nerve of finger    right small finger    Past Surgical History:  Procedure Laterality Date  . Arm surgery       History reviewed. No pertinent family history.  Social History:  reports that she has been smoking cigarettes.  She has been smoking about 1.00 pack per day. She has never used smokeless tobacco. She reports that she drinks about 60.0 oz of alcohol per week. She reports that she has current or past drug history. Drugs: Cocaine and Marijuana.  Allergies:  Allergies  Allergen Reactions  . Codeine Itching  . Sulfur Itching    Medications Prior to Admission  Medication Sig Dispense Refill Last Dose  . aspirin 325 MG EC tablet Take 650 mg by mouth every 6 (six) hours as needed for pain.   11/16/2017 at 1145  . HYDROcodone-acetaminophen (NORCO) 10-325 MG tablet Take 1 tablet by mouth every 6 (six) hours as needed.   Past Month at Unknown time  . lamoTRIgine (LAMICTAL) 100 MG tablet Take 100 mg by mouth daily.   Past Week at Unknown time  . Multiple Vitamins-Minerals (MULTIVITAMIN WITH MINERALS) tablet Take 1 tablet by mouth daily.   Past Month at Unknown time  . traZODone (DESYREL) 100 MG tablet Take 100 mg by mouth at bedtime as needed for sleep.    Past Month at Unknown time  . bacitracin-polymyxin b (POLYSPORIN) ophthalmic ointment Place 1 application into both eyes every 12 (twelve) hours. apply to eye every 12 hours while awake (Patient not taking: Reported on 09/20/2017) 3.5 g 0 Not Taking    ROS  Blood pressure (!) 145/101, pulse 92, temperature 97.7 F (36.5 C),  temperature source Oral, resp. rate 16, height  (1.651 m), weight 65.3 kg (144 lb), last menstrual period 11/16/2017, SpO2 99 %. Physical Exam  Heart- rrr Lungs- CTAB Abd- benign  Results for orders placed or performed during the hospital encounter of 11/16/17 (from the past 24 hour(s))  Pregnancy, urine     Status: None   Collection Time: 11/16/17 12:22 PM  Result Value Ref Range   Preg Test, Ur NEGATIVE NEGATIVE  Basic metabolic panel     Status: Abnormal   Collection Time: 11/16/17 12:24 PM  Result Value Ref Range   Sodium 136 135 - 145 mmol/L   Potassium 3.6 3.5 - 5.1 mmol/L   Chloride 100 (L) 101 - 111 mmol/L   CO2 23 22 - 32 mmol/L   Glucose, Bld 126 (H) 65 - 99 mg/dL   BUN 7 6 - 20 mg/dL   Creatinine, Ser 1.61 0.44 - 1.00 mg/dL   Calcium 9.4 8.9 - 09.6 mg/dL   GFR calc non Af Amer >60 >60 mL/min   GFR calc Af Amer >60 >60 mL/min   Anion gap 13 5 - 15  CBC     Status: Abnormal   Collection Time: 11/16/17 12:24 PM  Result Value Ref Range   WBC 6.8 4.0 - 10.5 K/uL   RBC 4.44 3.87 - 5.11  MIL/uL   Hemoglobin 15.9 (H) 12.0 - 15.0 g/dL   HCT 16.1 (H) 09.6 - 04.5 %   MCV 104.3 (H) 78.0 - 100.0 fL   MCH 35.8 (H) 26.0 - 34.0 pg   MCHC 34.3 30.0 - 36.0 g/dL   RDW 40.9 81.1 - 91.4 %   Platelets 170 150 - 400 K/uL    No results found.  Assessment/Plan: + CIN 3 on ECC and cervical biopsy Plan for CKC and ECC.   She understands the risks of surgery, including, but not to infection, bleeding, DVTs, damage to bowel, bladder, ureters. She wishes to proceed.     [name] 11/16/2017, 1:39 PM

## 2017-11-16 NOTE — Discharge Instructions (Addendum)
Cervical Conization, Care After This sheet gives you information about how to care for yourself after your procedure. Your doctor may also give you more specific instructions. If you have problems or questions, contact your doctor. Follow these instructions at home: Medicines  Take over-the-counter and prescription medicines only as told by your doctor.  Do not take aspirin until your doctor says it is okay.  If you take pain medicine: ? You may have constipation. To help treat this, your doctor may tell you to:  Drink enough fluid to keep your pee (urine) clear or pale yellow.  Take medicines.  Eat foods that are high in fiber. These include fresh fruits and vegetables, whole grains, bran, and beans.  Limit foods that are high in fat and sugar. These include fried foods and sweet foods. ? Do not drive or use heavy machines. General instructions  You can eat your usual diet unless your doctor tells you not to do so.  Take showers for the first week. Do not take baths, swim, or use hot tubs until your doctor says it is okay.  Do not douche, use tampons, or have sex until your doctor says it is okay.  For 7-14 days after your procedure, avoid: ? Being very active. ? Exercising. ? Heavy lifting.  Keep all follow-up visits as told by your doctor. This is important. Contact a doctor if:  You have a rash.  You are dizzy or lightheaded.  You feel sick to your stomach (nauseous).  You throw up (vomit).  You have fluid from your vagina (vaginal discharge) that smells bad. Get help right away if:  There are blood clots coming from your vagina.  You have more bleeding than you would have in a normal period. For example, you soak a pad in less than 1 hour.  You have a fever.  You have more and more cramps.  You pass out (faint).  You have pain when peeing.  Your have a lot of pain.  Your pain gets worse.  Your pain does not get better when you take your  medicine.  You have blood in your pee.  You throw up (vomit). Summary  After your procedure, take over-the-counter and prescription medicines only as told by your doctor.  Do not douche, use tampons, or have sex until your doctor says it is okay.  For about 7-14 days after your procedure, try not to exercise or lift heavy objects.  Get help right away if you have new symptoms, or if your symptoms become worse. This information is not intended to replace advice given to you by your health care provider. Make sure you discuss any questions you have with your health care provider. Document Released: 03/30/2008 Document Revised: 06/23/2016 Document Reviewed: 06/23/2016 Elsevier Interactive Patient Education  2017 [employer]   Post Anesthesia Home Care Instructions  NO IBUPROFEN PRODUCTS UNTIL: 9:30 PM TONIGHT  Activity: Get plenty of rest for the remainder of the day. A responsible individual must stay with you for 24 hours following the procedure.  For the next 24 hours, DO NOT: -Drive a car -Advertising copywriter -Drink alcoholic beverages -Take any medication unless instructed by your physician -Make any legal decisions or sign important papers.  Meals: Start with liquid foods such as gelatin or soup. Progress to regular foods as tolerated. Avoid greasy, spicy, heavy foods. If nausea and/or vomiting occur, drink only clear liquids until the nausea and/or vomiting subsides. Call your physician if vomiting continues.  Special Instructions/Symptoms:  Your throat may feel dry or sore from the anesthesia or the breathing tube placed in your throat during surgery. If this causes discomfort, gargle with warm salt water. The discomfort should disappear within 24 hours.  If you had a scopolamine patch placed behind your ear for the management of post- operative nausea and/or vomiting:  1. The medication in the patch is effective for 72 hours, after which it should be removed.  Wrap patch  in a tissue and discard in the trash. Wash hands thoroughly with soap and water. 2. You may remove the patch earlier than 72 hours if you experience unpleasant side effects which may include dry mouth, dizziness or visual disturbances. 3. Avoid touching the patch. Wash your hands with soap and water after contact with the patch.

## 2017-11-16 NOTE — Anesthesia Procedure Notes (Signed)
Procedure Name: LMA Insertion Date/Time: 11/16/2017 2:51 PM Performed by: Graciela Husbands, CRNA Pre-anesthesia Checklist: Patient identified, Patient being monitored, Emergency Drugs available, Timeout performed and Suction available Patient Re-evaluated:Patient Re-evaluated prior to induction Oxygen Delivery Method: Circle System Utilized Preoxygenation: Pre-oxygenation with 100% oxygen Induction Type: IV induction Ventilation: Mask ventilation without difficulty LMA: LMA inserted LMA Size: 4.0 Number of attempts: 1 Placement Confirmation: positive ETCO2 and breath sounds checked- equal and bilateral Tube secured with: Tape Dental Injury: Teeth and Oropharynx as per pre-operative assessment

## 2017-11-17 ENCOUNTER — Encounter (HOSPITAL_COMMUNITY): Payer: Self-pay | Admitting: Obstetrics & Gynecology

## 2017-11-22 ENCOUNTER — Telehealth: Payer: Self-pay

## 2017-11-22 NOTE — Telephone Encounter (Signed)
Called patient to discuss pap smear results and to follow up in six months for another pap smear.

## 2017-11-22 NOTE — Telephone Encounter (Signed)
-----   Message from [name], MD sent at 11/22/2017  8:14 AM EDT ----- She will need a pap in 6 months. Thanks

## 2017-11-24 ENCOUNTER — Ambulatory Visit: Payer: Self-pay | Admitting: Obstetrics & Gynecology

## 2017-12-04 ENCOUNTER — Encounter (HOSPITAL_COMMUNITY): Payer: Self-pay | Admitting: *Deleted

## 2017-12-04 ENCOUNTER — Emergency Department (HOSPITAL_COMMUNITY)
Admission: EM | Admit: 2017-12-04 | Discharge: 2017-12-04 | Disposition: A | Discharge status: Home / Self Care | Payer: Self-pay | Attending: Emergency Medicine | Admitting: Emergency Medicine

## 2017-12-04 DIAGNOSIS — Z5321 Procedure and treatment not carried out due to patient leaving prior to being seen by health care provider: Secondary | ICD-10-CM | POA: Insufficient documentation

## 2017-12-04 DIAGNOSIS — N99820 Postprocedural hemorrhage and hematoma of a genitourinary system organ or structure following a genitourinary system procedure: Secondary | ICD-10-CM | POA: Insufficient documentation

## 2017-12-04 NOTE — ED Triage Notes (Signed)
Pt states she has had vaginal bleeding for the past 2 weeks. Pt had surgery on her cervix 2 weeks ago. Pt states she has pain in her groin area and right leg.

## 2017-12-04 NOTE — ED Notes (Signed)
Pt called for room, no response from lobby 

## 2017-12-05 NOTE — ED Notes (Signed)
Follow up call made  Not home  1250  12/05/17  s Calyb Mcquarrie rn

## 2017-12-26 ENCOUNTER — Other Ambulatory Visit: Payer: Self-pay

## 2017-12-28 ENCOUNTER — Ambulatory Visit
Admission: RE | Admit: 2017-12-28 | Discharge: 2017-12-28 | Disposition: A | Discharge status: Home / Self Care | Payer: No Typology Code available for payment source | Source: Ambulatory Visit | Attending: Obstetrics and Gynecology | Admitting: Obstetrics and Gynecology

## 2017-12-28 DIAGNOSIS — N6320 Unspecified lump in the left breast, unspecified quadrant: Secondary | ICD-10-CM

## 2018-01-27 IMAGING — DX DG CHEST 2V
2 series · 2 of 2 positions shown · non-contrast
Comparison: None.

CLINICAL DATA: Fall, rib injury.  Left chest pain

EXAM:
CHEST  2 VIEW

[chest pa]
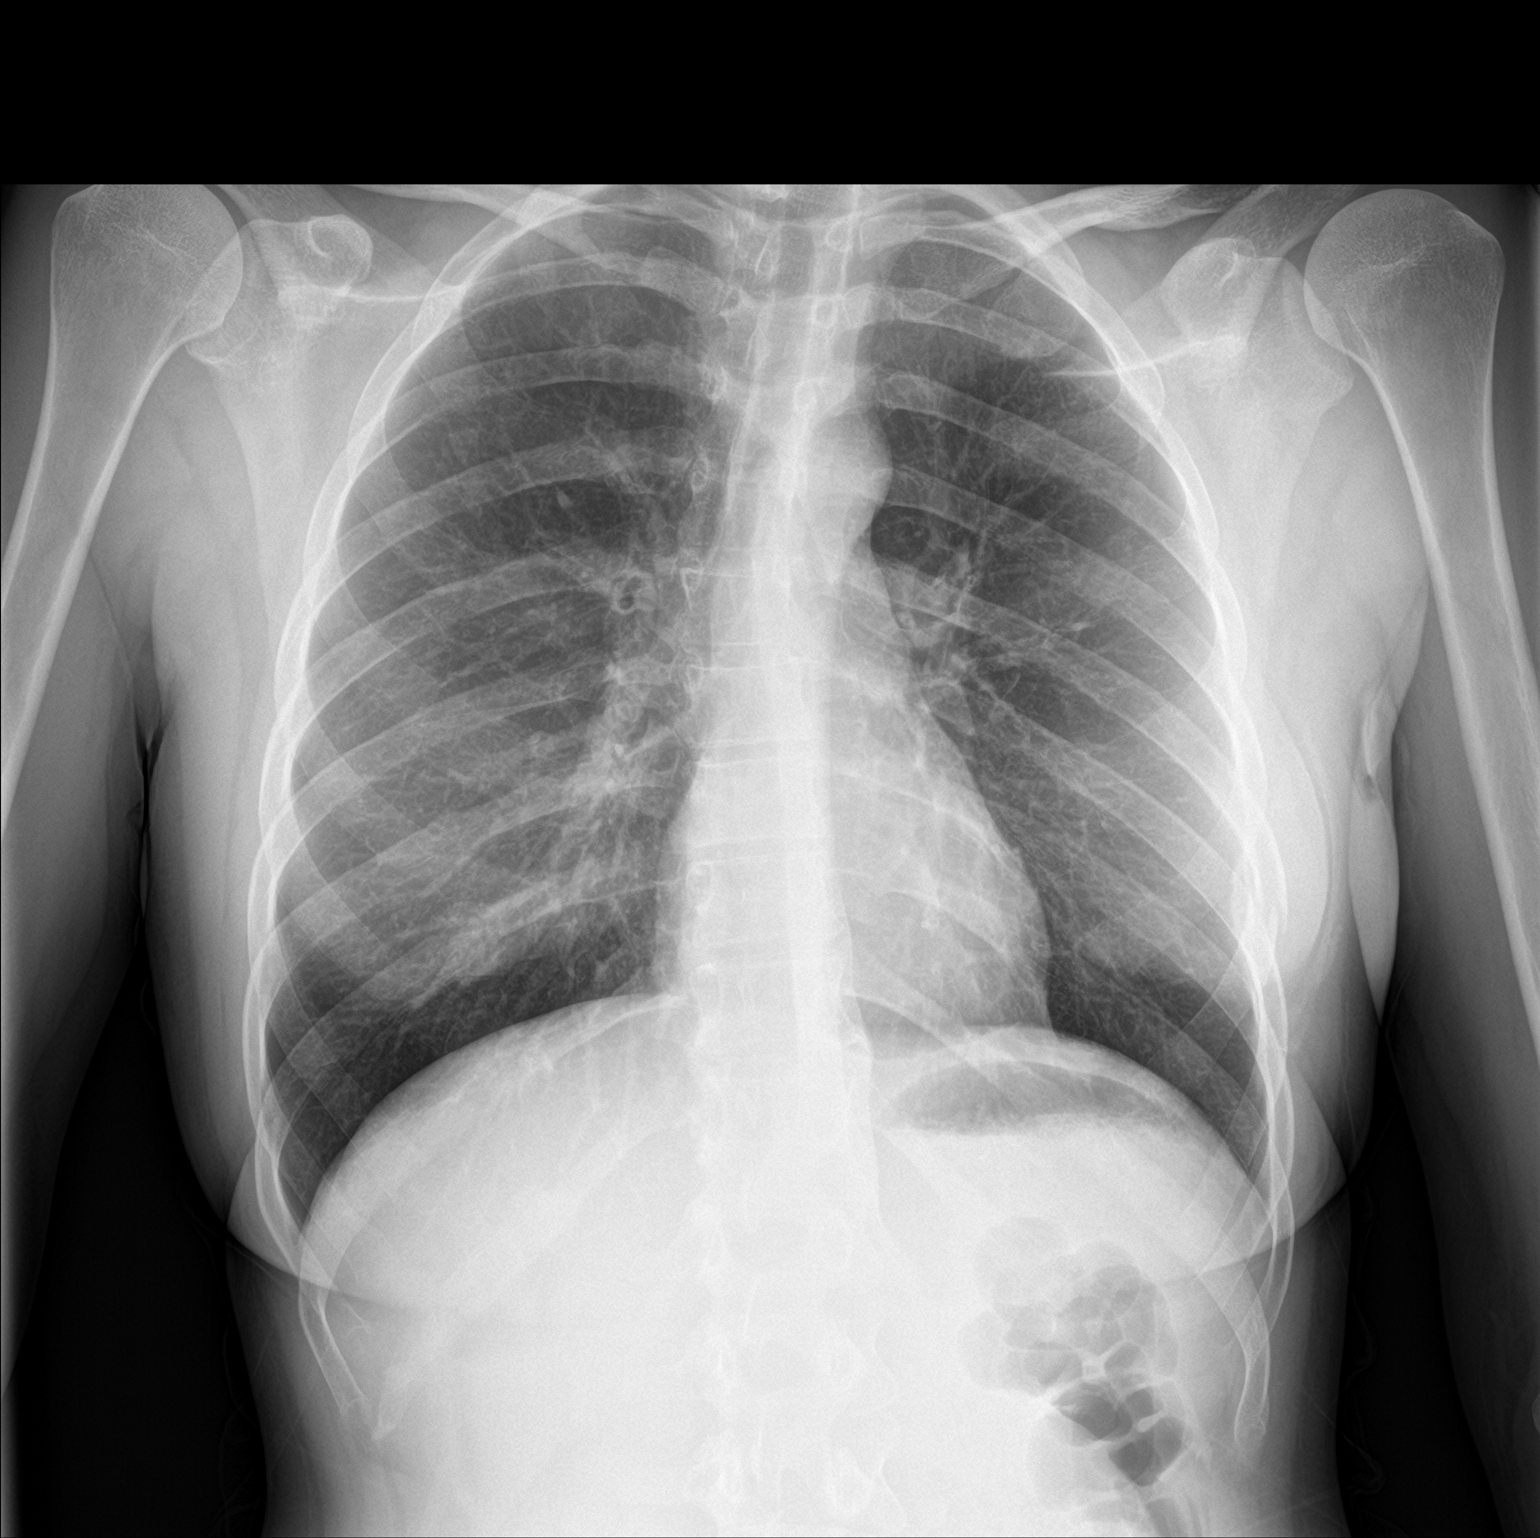

[chest lat]
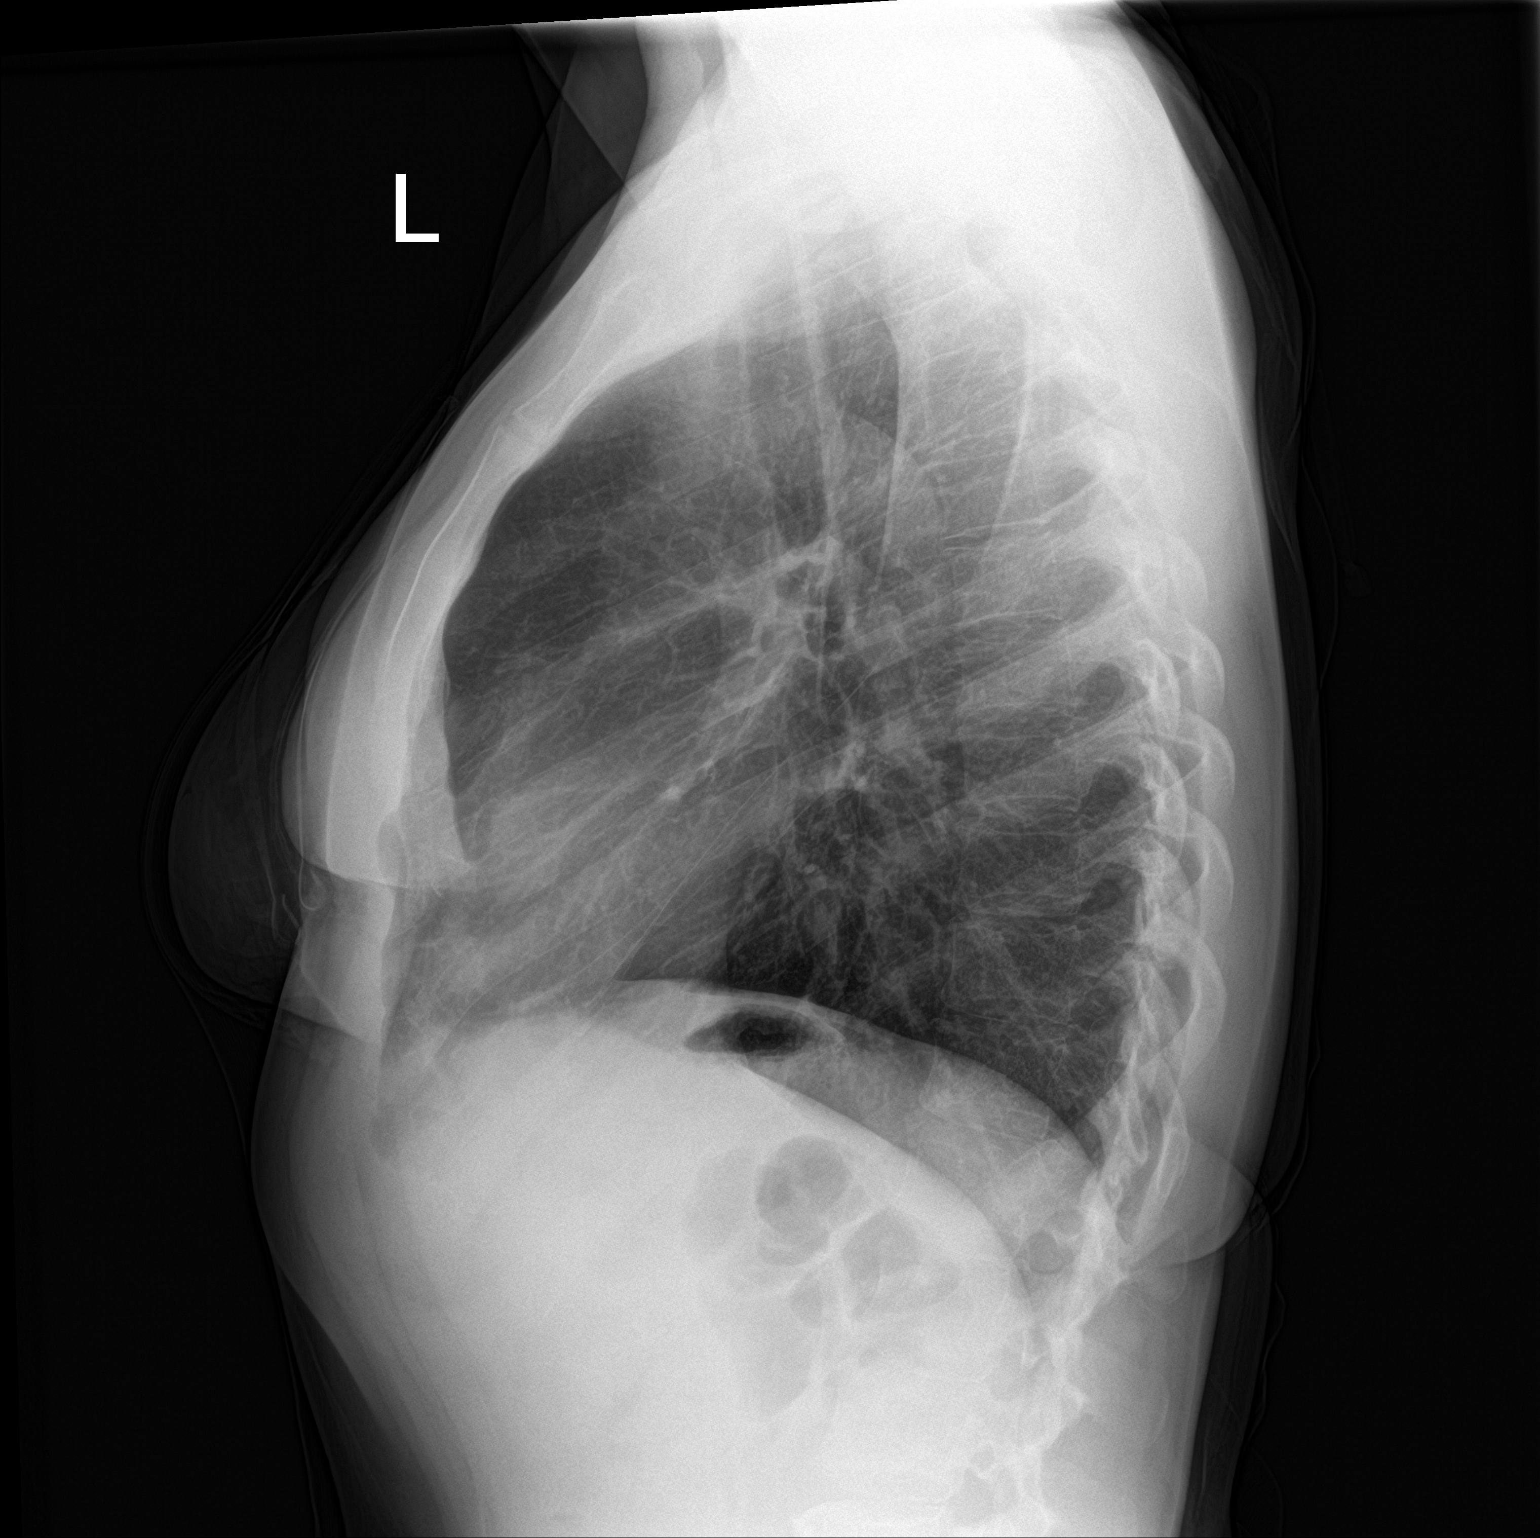

[2 of 2 positions shown; findings below may reference images not displayed]

FINDINGS: The heart size and mediastinal contours are within normal limits.
Both lungs are clear. The visualized skeletal structures are
unremarkable.
IMPRESSION: No active cardiopulmonary disease.

## 2018-05-10 ENCOUNTER — Emergency Department (HOSPITAL_COMMUNITY)
Admission: EM | Admit: 2018-05-10 | Discharge: 2018-05-10 | Discharge status: Against Medical Advice | Payer: No Typology Code available for payment source

## 2019-07-16 ENCOUNTER — Ambulatory Visit: Payer: Self-pay | Attending: Internal Medicine

## 2019-10-13 ENCOUNTER — Other Ambulatory Visit: Payer: Self-pay

## 2019-10-13 ENCOUNTER — Emergency Department (HOSPITAL_BASED_OUTPATIENT_CLINIC_OR_DEPARTMENT_OTHER)
Admit: 2019-10-13 | Discharge: 2019-10-13 | Disposition: A | Discharge status: Home / Self Care | Payer: Self-pay | Attending: Emergency Medicine | Admitting: Emergency Medicine

## 2019-10-13 ENCOUNTER — Encounter (HOSPITAL_COMMUNITY): Payer: Self-pay | Admitting: *Deleted

## 2019-10-13 ENCOUNTER — Emergency Department (HOSPITAL_COMMUNITY)
Admission: EM | Admit: 2019-10-13 | Discharge: 2019-10-13 | Disposition: A | Discharge status: Home / Self Care | Payer: Self-pay | Attending: Emergency Medicine | Admitting: Emergency Medicine

## 2019-10-13 DIAGNOSIS — R05 Cough: Secondary | ICD-10-CM | POA: Insufficient documentation

## 2019-10-13 DIAGNOSIS — F17210 Nicotine dependence, cigarettes, uncomplicated: Secondary | ICD-10-CM | POA: Insufficient documentation

## 2019-10-13 DIAGNOSIS — R2243 Localized swelling, mass and lump, lower limb, bilateral: Secondary | ICD-10-CM | POA: Insufficient documentation

## 2019-10-13 DIAGNOSIS — Z79899 Other long term (current) drug therapy: Secondary | ICD-10-CM | POA: Insufficient documentation

## 2019-10-13 DIAGNOSIS — R600 Localized edema: Secondary | ICD-10-CM

## 2019-10-13 DIAGNOSIS — F1210 Cannabis abuse, uncomplicated: Secondary | ICD-10-CM | POA: Insufficient documentation

## 2019-10-13 DIAGNOSIS — R609 Edema, unspecified: Secondary | ICD-10-CM

## 2019-10-13 DIAGNOSIS — M79661 Pain in right lower leg: Secondary | ICD-10-CM | POA: Insufficient documentation

## 2019-10-13 DIAGNOSIS — F1410 Cocaine abuse, uncomplicated: Secondary | ICD-10-CM | POA: Insufficient documentation

## 2019-10-13 LAB — CBC WITH DIFFERENTIAL/PLATELET
Abs Immature Granulocytes: 0.01 10*3/uL (ref 0.00–0.07)
Basophils Absolute: 0.1 10*3/uL (ref 0.0–0.1)
Basophils Relative: 1 %
Eosinophils Absolute: 0.2 10*3/uL (ref 0.0–0.5)
Eosinophils Relative: 3 %
HCT: 43.3 % (ref 36.0–46.0)
Hemoglobin: 15.1 g/dL — ABNORMAL HIGH (ref 12.0–15.0)
Immature Granulocytes: 0 %
Lymphocytes Relative: 29 %
Lymphs Abs: 1.7 10*3/uL (ref 0.7–4.0)
MCH: 35 pg — ABNORMAL HIGH (ref 26.0–34.0)
MCHC: 34.9 g/dL (ref 30.0–36.0)
MCV: 100.5 fL — ABNORMAL HIGH (ref 80.0–100.0)
Monocytes Absolute: 0.5 10*3/uL (ref 0.1–1.0)
Monocytes Relative: 9 %
Neutro Abs: 3.4 10*3/uL (ref 1.7–7.7)
Neutrophils Relative %: 58 %
Platelets: 276 10*3/uL (ref 150–400)
RBC: 4.31 MIL/uL (ref 3.87–5.11)
RDW: 12.4 % (ref 11.5–15.5)
WBC: 6 10*3/uL (ref 4.0–10.5)
nRBC: 0 % (ref 0.0–0.2)

## 2019-10-13 LAB — COMPREHENSIVE METABOLIC PANEL
ALT: 56 U/L — ABNORMAL HIGH (ref 0–44)
AST: 76 U/L — ABNORMAL HIGH (ref 15–41)
Albumin: 4.2 g/dL (ref 3.5–5.0)
Alkaline Phosphatase: 100 U/L (ref 38–126)
Anion gap: 12 (ref 5–15)
BUN: 7 mg/dL (ref 6–20)
CO2: 24 mmol/L (ref 22–32)
Calcium: 9.1 mg/dL (ref 8.9–10.3)
Chloride: 102 mmol/L (ref 98–111)
Creatinine, Ser: 0.66 mg/dL (ref 0.44–1.00)
GFR calc Af Amer: >60 mL/min (ref >60)
GFR calc non Af Amer: >60 mL/min (ref >60)
Glucose, Bld: 73 mg/dL (ref 70–99)
Potassium: 3.6 mmol/L (ref 3.5–5.1)
Sodium: 138 mmol/L (ref 135–145)
Total Bilirubin: 0.8 mg/dL (ref 0.3–1.2)
Total Protein: 8 g/dL (ref 6.5–8.1)

## 2019-10-13 LAB — BRAIN NATRIURETIC PEPTIDE: B Natriuretic Peptide: 20.7 pg/mL (ref 0.0–100.0)

## 2019-10-13 NOTE — ED Notes (Signed)
Patient left ED prior to getting discharge paperwork.

## 2019-10-13 NOTE — Progress Notes (Signed)
Bilateral lower extremity venous duplex has been completed. Preliminary results can be found in CV Proc through chart review.  Results were given to [employer] PA.  10/13/19 1:12 PM Olen Cordial RVT

## 2019-10-13 NOTE — Discharge Instructions (Addendum)
You can buy over-the-counter compression stockings to help reduce the swelling in your legs.  You can also keep them elevated to help reduce the swelling.  Please follow up with your primary care provider within 5-7 days for re-evaluation of your symptoms. If you do not have a primary care provider, information for a healthcare clinic has been provided for you to make arrangements for follow up care. Please return to the emergency department for any new or worsening symptoms.

## 2019-10-13 NOTE — ED Triage Notes (Signed)
Pt complains of bilateral leg swelling, worse in right leg for the past 3 weeks. Pt also complains of bilateral knee pain. She feels like she has fluid in both knees.

## 2019-10-13 NOTE — ED Provider Notes (Signed)
[facility] DEPT Provider Note   CSN: [id number] Arrival date & time: 10/13/19  1109     History Chief Complaint  Patient presents with  . Leg Swelling    Shelly Tucker is a 39 y.o. female.  HPI   Patient is a 39 year old female with a history of anxiety, bipolar 1 disorder, history of EtOH abuse, who presents to the emergency department today for evaluation of bilateral lower extremity swelling.  States that has been ongoing for the last month.  She also has pain to the right calf.  She denies any chest pain, shortness of breath or pleuritic pain.  She has had a mild cough.  She did have an injury to the right shin when moving furniture a few days ago however symptoms started before this.  She denies any other injuries.  Denies hemoptysis, recent surgery, recent long travel, hormone use, personal hx of cancer, or hx of DVT/PE. She denies any recent IVDU.  Past Medical History:  Diagnosis Date  . Anxiety   . Bipolar 1 disorder ([place])   . History of ETOH abuse   . Laceration of digital nerve of finger    right small finger    Patient Active Problem List   Diagnosis Date Noted  . Alcohol dependence ([place]) 10/24/2013    Past Surgical History:  Procedure Laterality Date  . Arm surgery     . CERVICAL CONIZATION W/BX N/A 11/16/2017   Procedure: CONIZATION CERVIX WITH BIOPSY - COLD KNIFE;  Surgeon: Emily Filbert, MD;  Location: [facility];  Service: Gynecology;  Laterality: N/A;     OB History    Gravida  0   Para  0   Term  0   Preterm  0   AB  0   Living  0     SAB  0   TAB  0   Ectopic  0   Multiple  0   Live Births  0           No family history on file.  Social History   Tobacco Use  . Smoking status: Current Every Day Smoker    Packs/day: 1.00    Types: Cigarettes  . Smokeless tobacco: Never Used  Substance Use Topics  . Alcohol use: Yes    Alcohol/week: 100.0 standard drinks    Types: 100 Cans of beer per week      Comment: daily, heavy at times pt states 4-5 40oz at a time  . Drug use: Yes    Types: Cocaine, Marijuana    Comment: marijuana    Home Medications Prior to Admission medications   Medication Sig Start Date End Date Taking? Authorizing Provider  HYDROcodone-acetaminophen (NORCO) 10-325 MG tablet Take 1 tablet by mouth every 6 (six) hours as needed.    [provider]  ibuprofen (ADVIL,MOTRIN) 600 MG tablet Take 1 tablet (600 mg total) by mouth every 6 (six) hours as needed. 11/16/17   Emily Filbert, MD  lamoTRIgine (LAMICTAL) 100 MG tablet Take 100 mg by mouth daily.    [provider]  Multiple Vitamins-Minerals (MULTIVITAMIN WITH MINERALS) tablet Take 1 tablet by mouth daily.    [provider]  traZODone (DESYREL) 100 MG tablet Take 100 mg by mouth at bedtime as needed for sleep.     [provider]    Allergies    Codeine and Sulfur  Review of Systems   Review of Systems  Constitutional: Negative for chills  and fever.  HENT: Negative for ear pain and sore throat.   Eyes: Negative for visual disturbance.  Respiratory: Positive for cough. Negative for shortness of breath.   Cardiovascular: Positive for leg swelling. Negative for chest pain.  Gastrointestinal: Negative for abdominal pain, constipation, diarrhea, nausea and vomiting.  Genitourinary: Negative for dysuria and hematuria.  Musculoskeletal:       Leg pain  Skin: Positive for wound.  Neurological: Negative for headaches.  All other systems reviewed and are negative.   Physical Exam Updated Vital Signs BP (!) 159/105 (BP Location: Left Arm)   Pulse 80   Temp 97.6 F (36.4 C) (Oral)   Resp 18   Ht 5\' 5"  (1.651 m)   Wt 72.6 kg   SpO2 98%   BMI 26.63 kg/m   Physical Exam Vitals and nursing note reviewed.  Constitutional:      General: She is not in acute distress.    Appearance: She is well-developed.  HENT:     Head: Normocephalic and atraumatic.  Eyes:      Conjunctiva/sclera: Conjunctivae normal.  Cardiovascular:     Rate and Rhythm: Normal rate and regular rhythm.     Heart sounds: Normal heart sounds. No murmur.  Pulmonary:     Effort: Pulmonary effort is normal. No respiratory distress.     Breath sounds: Normal breath sounds. No wheezing, rhonchi or rales.  Abdominal:     General: Bowel sounds are normal.     Palpations: Abdomen is soft.     Tenderness: There is no abdominal tenderness.  Musculoskeletal:        General: Tenderness (right calf) present.     Cervical back: Neck supple.     Right lower leg: Edema present.     Left lower leg: Edema present.     Comments: Old scabbed abrasion noted to the right shin  Skin:    General: Skin is warm and dry.  Neurological:     Mental Status: She is alert.     ED Results / Procedures / Treatments   Labs (all labs ordered are listed, but only abnormal results are displayed) Labs Reviewed  CBC WITH DIFFERENTIAL/PLATELET - Abnormal; Notable for the following components:      Result Value   Hemoglobin 15.1 (*)    MCV 100.5 (*)    MCH 35.0 (*)    All other components within normal limits  COMPREHENSIVE METABOLIC PANEL - Abnormal; Notable for the following components:   AST 76 (*)    ALT 56 (*)    All other components within normal limits  BRAIN NATRIURETIC PEPTIDE    EKG None  Radiology VAS LOWER EXTREMITY VENOUS (DVT) (ONLY [facility] & [facility] 7a-7p)  Result Date: 10/13/2019  Lower Venous DVTStudy Indications: Edema.  Risk Factors: None identified. Limitations: Poor ultrasound/tissue interface and patient movement. Comparison Study: No prior studies. Performing Technologist: 12/13/2019 RVT  Examination Guidelines: A complete evaluation includes B-mode imaging, spectral Doppler, color Doppler, and power Doppler as needed of all accessible portions of each vessel. Bilateral testing is considered an integral part of a complete examination. Limited examinations for reoccurring  indications may be performed as noted. The reflux portion of the exam is performed with the patient in reverse Trendelenburg.  +---------+---------------+---------+-----------+----------+--------------+ RIGHT    CompressibilityPhasicitySpontaneityPropertiesThrombus Aging +---------+---------------+---------+-----------+----------+--------------+ CFV      Full           Yes      Yes                                 +---------+---------------+---------+-----------+----------+--------------+  SFJ      Full                                                        +---------+---------------+---------+-----------+----------+--------------+ FV Prox  Full                                                        +---------+---------------+---------+-----------+----------+--------------+ FV Mid   Full                                                        +---------+---------------+---------+-----------+----------+--------------+ FV DistalFull                                                        +---------+---------------+---------+-----------+----------+--------------+ PFV      Full                                                        +---------+---------------+---------+-----------+----------+--------------+ POP      Full           Yes      Yes                                 +---------+---------------+---------+-----------+----------+--------------+ PTV      Full                                                        +---------+---------------+---------+-----------+----------+--------------+ PERO     Full                                                        +---------+---------------+---------+-----------+----------+--------------+   +---------+---------------+---------+-----------+----------+--------------+ LEFT     CompressibilityPhasicitySpontaneityPropertiesThrombus Aging  +---------+---------------+---------+-----------+----------+--------------+ CFV      Full           Yes      Yes                                 +---------+---------------+---------+-----------+----------+--------------+ SFJ      Full                                                        +---------+---------------+---------+-----------+----------+--------------+  FV Prox  Full                                                        +---------+---------------+---------+-----------+----------+--------------+ FV Mid   Full                                                        +---------+---------------+---------+-----------+----------+--------------+ FV DistalFull                                                        +---------+---------------+---------+-----------+----------+--------------+ PFV      Full                                                        +---------+---------------+---------+-----------+----------+--------------+ POP      Full           Yes      Yes                                 +---------+---------------+---------+-----------+----------+--------------+ PTV      Full                                                        +---------+---------------+---------+-----------+----------+--------------+ PERO     Full                                                        +---------+---------------+---------+-----------+----------+--------------+     Summary: RIGHT: - There is no evidence of deep vein thrombosis in the lower extremity.  - No cystic structure found in the popliteal fossa.  LEFT: - There is no evidence of deep vein thrombosis in the lower extremity.  - No cystic structure found in the popliteal fossa.  *See table(s) above for measurements and observations.    Preliminary     Procedures Procedures (including critical care time)  Medications Ordered in ED Medications - No data to display  ED Course  I have reviewed the  triage vital signs and the nursing notes.  Pertinent labs & imaging results that were available during my care of the patient were reviewed by me and considered in my medical decision making (see chart for details).    MDM Rules/Calculators/A&P                      39 year old female presenting for evaluation of bilateral lower extremity edema that has been ongoing for about a  month.  She also reports some associated calf pain.  She does not have any risk factors for VTE.  She denies any shortness of breath to suggest significant heart failure.  We will check labs and bilateral lower extremity ultrasounds to rule out VTE.  CBC is without leukocytosis or anemia CMP is without evidence of liver or kidney failure.  No significant electrolyte derangement. BNP is in normal making heart failure much less likely.  Bilateral lower extremity ultrasounds are negative for DVT  Patient has evidence of mild lower extremity edema.  This is most likely related to dependent edema rather than emergent cause at this time.  Have explained symptomatic management with elevation and compression stockings.  Have also advised to follow-up with PCP should symptoms persist.  Have advised on return precautions.  She voiced understanding plan reasons return.  All questions answered.  Patient stable for discharge.  Final Clinical Impression(s) / ED Diagnoses Final diagnoses:  Peripheral edema    Rx / DC Orders ED Discharge Orders    None       Rayne Du 10/13/19 1350    Bethann Berkshire, MD 10/13/19 1350

## 2019-11-08 ENCOUNTER — Encounter (HOSPITAL_COMMUNITY): Payer: Self-pay

## 2019-11-08 ENCOUNTER — Emergency Department (HOSPITAL_COMMUNITY)
Admission: EM | Admit: 2019-11-08 | Discharge: 2019-11-08 | Disposition: A | Discharge status: Home / Self Care | Payer: Self-pay | Attending: Emergency Medicine | Admitting: Emergency Medicine

## 2019-11-08 ENCOUNTER — Other Ambulatory Visit: Payer: Self-pay

## 2019-11-08 DIAGNOSIS — F10929 Alcohol use, unspecified with intoxication, unspecified: Secondary | ICD-10-CM | POA: Insufficient documentation

## 2019-11-08 DIAGNOSIS — R1110 Vomiting, unspecified: Secondary | ICD-10-CM | POA: Insufficient documentation

## 2019-11-08 DIAGNOSIS — Z5321 Procedure and treatment not carried out due to patient leaving prior to being seen by health care provider: Secondary | ICD-10-CM | POA: Insufficient documentation

## 2019-11-08 DIAGNOSIS — R079 Chest pain, unspecified: Secondary | ICD-10-CM | POA: Insufficient documentation

## 2019-11-08 NOTE — ED Notes (Addendum)
Pt acting out in lobby and outside. Pt heard on phone calling 911 telling them she is dying and that we wouldn't let her back. Pt seen leaving the property with significant other.

## 2019-11-08 NOTE — ED Notes (Signed)
Husband came and got her. Said they were leaving.

## 2019-11-08 NOTE — ED Triage Notes (Signed)
Pt sts she drank 2 40oz and she is throwing up. No emesis since this am.

## 2019-11-08 NOTE — ED Notes (Signed)
Pt and visitor in waiting room cussing and yelling at this NT prior to triage. This NT explained VS were taken and pt would be taken back to triage. Visitor took pt and stated they were leaving.

## 2019-12-28 ENCOUNTER — Emergency Department (HOSPITAL_COMMUNITY): Payer: Self-pay

## 2019-12-28 ENCOUNTER — Encounter (HOSPITAL_COMMUNITY): Payer: Self-pay | Admitting: Emergency Medicine

## 2019-12-28 ENCOUNTER — Emergency Department (HOSPITAL_COMMUNITY)
Admission: EM | Admit: 2019-12-28 | Discharge: 2019-12-28 | Disposition: A | Discharge status: Home / Self Care | Payer: Self-pay | Attending: Emergency Medicine | Admitting: Emergency Medicine

## 2019-12-28 ENCOUNTER — Other Ambulatory Visit: Payer: Self-pay

## 2019-12-28 DIAGNOSIS — T1490XA Injury, unspecified, initial encounter: Secondary | ICD-10-CM

## 2019-12-28 DIAGNOSIS — M25461 Effusion, right knee: Secondary | ICD-10-CM | POA: Insufficient documentation

## 2019-12-28 DIAGNOSIS — M25571 Pain in right ankle and joints of right foot: Secondary | ICD-10-CM | POA: Insufficient documentation
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
## 2019-12-28 NOTE — ED Notes (Signed)
Pt not visualized in lobby. Pt called multiple times.

## 2021-05-12 ENCOUNTER — Emergency Department: Payer: Self-pay

## 2021-05-12 ENCOUNTER — Encounter: Payer: Self-pay | Admitting: Emergency Medicine

## 2021-05-12 ENCOUNTER — Emergency Department
Admission: EM | Admit: 2021-05-12 | Discharge: 2021-05-12 | Disposition: A | Discharge status: Home / Self Care | Payer: Self-pay | Attending: Emergency Medicine | Admitting: Emergency Medicine

## 2021-05-12 ENCOUNTER — Other Ambulatory Visit: Payer: Self-pay

## 2021-05-12 DIAGNOSIS — R2241 Localized swelling, mass and lump, right lower limb: Secondary | ICD-10-CM | POA: Insufficient documentation

## 2021-05-12 DIAGNOSIS — R202 Paresthesia of skin: Secondary | ICD-10-CM | POA: Insufficient documentation

## 2021-05-12 DIAGNOSIS — Z5321 Procedure and treatment not carried out due to patient leaving prior to being seen by health care provider: Secondary | ICD-10-CM | POA: Insufficient documentation

## 2021-05-12 LAB — CBC WITH DIFFERENTIAL/PLATELET
Abs Immature Granulocytes: 0.04 10*3/uL (ref 0.00–0.07)
Basophils Absolute: 0.1 10*3/uL (ref 0.0–0.1)
Basophils Relative: 1 %
Eosinophils Absolute: 0.2 10*3/uL (ref 0.0–0.5)
Eosinophils Relative: 2 %
HCT: 38.9 % (ref 36.0–46.0)
Hemoglobin: 13.5 g/dL (ref 12.0–15.0)
Immature Granulocytes: 0 %
Lymphocytes Relative: 14 %
Lymphs Abs: 1.4 10*3/uL (ref 0.7–4.0)
MCH: 33.3 pg (ref 26.0–34.0)
MCHC: 34.7 g/dL (ref 30.0–36.0)
MCV: 96 fL (ref 80.0–100.0)
Monocytes Absolute: 0.8 10*3/uL (ref 0.1–1.0)
Monocytes Relative: 8 %
Neutro Abs: 7.1 10*3/uL (ref 1.7–7.7)
Neutrophils Relative %: 75 %
Platelets: 368 10*3/uL (ref 150–400)
RBC: 4.05 MIL/uL (ref 3.87–5.11)
RDW: 12.8 % (ref 11.5–15.5)
WBC: 9.6 10*3/uL (ref 4.0–10.5)
nRBC: 0 % (ref 0.0–0.2)

## 2021-05-12 LAB — COMPREHENSIVE METABOLIC PANEL
ALT: 27 U/L (ref 0–44)
AST: 36 U/L (ref 15–41)
Albumin: 3.6 g/dL (ref 3.5–5.0)
Alkaline Phosphatase: 84 U/L (ref 38–126)
Anion gap: 10 (ref 5–15)
BUN: 7 mg/dL (ref 6–20)
CO2: 22 mmol/L (ref 22–32)
Calcium: 8.6 mg/dL — ABNORMAL LOW (ref 8.9–10.3)
Chloride: 102 mmol/L (ref 98–111)
Creatinine, Ser: 0.71 mg/dL (ref 0.44–1.00)
GFR, Estimated: >60 mL/min (ref >60)
Glucose, Bld: 95 mg/dL (ref 70–99)
Potassium: 3.2 mmol/L — ABNORMAL LOW (ref 3.5–5.1)
Sodium: 134 mmol/L — ABNORMAL LOW (ref 135–145)
Total Bilirubin: 0.7 mg/dL (ref 0.3–1.2)
Total Protein: 7.3 g/dL (ref 6.5–8.1)

## 2021-05-12 LAB — LACTIC ACID, PLASMA: Lactic Acid, Venous: 1.2 mmol/L (ref 0.5–1.9)

## 2021-05-12 NOTE — ED Triage Notes (Signed)
Pt with noted R foot swelling and redness. Pt states numbness to R foot, pt with severe swelling noted, redness noted to extend up R calf.   Of note patient also repeatedly falling asleep during triage, however awakens to answer questions appropriately, states she has not slept all night.

## 2023-03-25 ENCOUNTER — Emergency Department (HOSPITAL_COMMUNITY): Admission: EM | Admit: 2023-03-25 | Discharge: 2023-03-25 | Discharge status: Against Medical Advice | Payer: 59

## 2023-03-25 NOTE — ED Triage Notes (Signed)
Pt went out to lobby to get her belongings and then told security she was leaving.

## 2023-03-29 ENCOUNTER — Encounter (HOSPITAL_COMMUNITY): Payer: Self-pay

## 2023-03-29 ENCOUNTER — Emergency Department (HOSPITAL_COMMUNITY)
Admission: EM | Admit: 2023-03-29 | Discharge: 2023-03-29 | Discharge status: Against Medical Advice | Payer: 59 | Attending: Emergency Medicine | Admitting: Emergency Medicine

## 2023-03-29 DIAGNOSIS — N939 Abnormal uterine and vaginal bleeding, unspecified: Secondary | ICD-10-CM | POA: Insufficient documentation

## 2023-03-29 DIAGNOSIS — Z5321 Procedure and treatment not carried out due to patient leaving prior to being seen by health care provider: Secondary | ICD-10-CM | POA: Insufficient documentation

## 2023-03-29 LAB — CBC
HCT: 38.6 % (ref 36.0–46.0)
Hemoglobin: 13.7 g/dL (ref 12.0–15.0)
MCH: 37.3 pg — ABNORMAL HIGH (ref 26.0–34.0)
MCHC: 35.5 g/dL (ref 30.0–36.0)
MCV: 105.2 fL — ABNORMAL HIGH (ref 80.0–100.0)
Platelets: 336 10*3/uL (ref 150–400)
RBC: 3.67 MIL/uL — ABNORMAL LOW (ref 3.87–5.11)
RDW: 13.1 % (ref 11.5–15.5)
WBC: 6 10*3/uL (ref 4.0–10.5)
nRBC: 0 % (ref 0.0–0.2)

## 2023-03-29 LAB — HCG, SERUM, QUALITATIVE: Preg, Serum: NEGATIVE

## 2023-03-29 NOTE — ED Triage Notes (Signed)
Pt is coming in for vaginal bleed that has been going on for a week that includes heavy bleeding. It is her time for her period but she mentions that the bleeding has went on longer than expected. There is some clots as well ranging from nickel to dime size. She states that she had a hx of cervical cancer that she was seen for by her OB in which they scraped her cervix but she never followed up. Pt states she otherwise feel okay at this moment.
